# Patient Record
Sex: Male | Born: 1985 | Race: White | Marital: Married | Smoking: Former smoker
Health system: Southern US, Community
[De-identification: ages and names within clinical notes are randomized; demographics above are authoritative.]

## PROBLEM LIST (undated history)

## (undated) DIAGNOSIS — B019 Varicella without complication: Secondary | ICD-10-CM

## (undated) DIAGNOSIS — I10 Essential (primary) hypertension: Secondary | ICD-10-CM

## (undated) DIAGNOSIS — K219 Gastro-esophageal reflux disease without esophagitis: Secondary | ICD-10-CM

## (undated) HISTORY — DX: Varicella without complication: B01.9

## (undated) HISTORY — PX: VASECTOMY: SHX75

---

## 2010-08-14 HISTORY — PX: HERNIA REPAIR: SHX51

## 2010-11-29 ENCOUNTER — Other Ambulatory Visit: Payer: Self-pay | Admitting: Occupational Medicine

## 2010-11-29 ENCOUNTER — Ambulatory Visit
Admission: RE | Admit: 2010-11-29 | Discharge: 2010-11-29 | Disposition: A | Discharge status: Home / Self Care | Payer: No Typology Code available for payment source | Source: Ambulatory Visit | Attending: Occupational Medicine | Admitting: Occupational Medicine

## 2010-11-29 DIAGNOSIS — Z021 Encounter for pre-employment examination: Secondary | ICD-10-CM

## 2011-03-11 ENCOUNTER — Encounter: Payer: Self-pay | Admitting: *Deleted

## 2011-03-11 ENCOUNTER — Emergency Department (HOSPITAL_BASED_OUTPATIENT_CLINIC_OR_DEPARTMENT_OTHER)
Admission: EM | Admit: 2011-03-11 | Discharge: 2011-03-11 | Disposition: A | Discharge status: Home / Self Care | Payer: 59 | Attending: Emergency Medicine | Admitting: Emergency Medicine

## 2011-03-11 DIAGNOSIS — I10 Essential (primary) hypertension: Secondary | ICD-10-CM | POA: Insufficient documentation

## 2011-03-11 DIAGNOSIS — Y92009 Unspecified place in unspecified non-institutional (private) residence as the place of occurrence of the external cause: Secondary | ICD-10-CM | POA: Insufficient documentation

## 2011-03-11 DIAGNOSIS — W2209XA Striking against other stationary object, initial encounter: Secondary | ICD-10-CM | POA: Insufficient documentation

## 2011-03-11 DIAGNOSIS — S93336A Other dislocation of unspecified foot, initial encounter: Secondary | ICD-10-CM | POA: Insufficient documentation

## 2011-03-11 DIAGNOSIS — K219 Gastro-esophageal reflux disease without esophagitis: Secondary | ICD-10-CM | POA: Insufficient documentation

## 2011-03-11 HISTORY — DX: Gastro-esophageal reflux disease without esophagitis: K21.9

## 2011-03-11 HISTORY — DX: Essential (primary) hypertension: I10

## 2011-03-11 NOTE — ED Notes (Signed)
Pt states he caught his left little toe on a baby gate earlier this p.m. ? Dislocation.

## 2011-03-11 NOTE — ED Provider Notes (Signed)
History     Chief Complaint  Patient presents with  . Toe Injury   HPI Comments: Pt states that he hit his baby toe on gate and it is facing in the wrong dislocation  Patient is a 25 y.o. male presenting with toe pain. The history is provided by the patient. No language interpreter was used.  Toe Pain This is a new problem. The current episode started today. The problem has been unchanged. Pertinent negatives include no fever or numbness. The symptoms are aggravated by walking and twisting. He has tried nothing for the symptoms.    Past Medical History  Diagnosis Date  . Hypertension   . GERD (gastroesophageal reflux disease)     Past Surgical History  Procedure Date  . Hernia repair     History reviewed. No pertinent family history.  History  Substance Use Topics  . Smoking status: Never Smoker   . Smokeless tobacco: Not on file  . Alcohol Use: No      Review of Systems  Constitutional: Negative for fever.  Respiratory: Negative.   Cardiovascular: Negative.   Musculoskeletal:       Pt c/o pain and deformity to left small toe  Skin: Negative.   Neurological: Negative for numbness.  Psychiatric/Behavioral: Negative.     Physical Exam  BP 143/86  Pulse 56  Temp(Src) 98.7 F (37.1 C) (Oral)  Resp 20  Ht 6\' 5"  (1.956 m)  Wt 235 lb (106.595 kg)  BMI 27.87 kg/m2  SpO2 96%  Physical Exam  Nursing note and vitals reviewed. Constitutional: He appears well-developed and well-nourished.  Cardiovascular: Normal rate and regular rhythm.   Pulmonary/Chest: Effort normal.  Musculoskeletal:       Feet:  Skin: Skin is warm.    ED Course  ORTHOPEDIC INJURY TREATMENT Date/Time: 03/11/2011 8:54 PM Performed by: Teressa Lower Authorized by: Billee Cashing Consent: Verbal consent obtained. Risks and benefits: risks, benefits and alternatives were discussed Consent given by: patient Patient understanding: patient states understanding of the procedure  being performed Patient identity confirmed: verbally with patient Time out: Immediately prior to procedure a "time out" was called to verify the correct patient, procedure, equipment, support staff and site/side marked as required. Injury location: toe Location details: left fifth toe Injury type: dislocation Pre-procedure neurovascular assessment: neurovascularly intact Local anesthesia used: no Patient sedated: no Manipulation performed: yes Immobilization: tape Post-procedure neurovascular assessment: post-procedure neurovascularly intact Post-procedure distal perfusion: normal Post-procedure neurological function: normal Post-procedure range of motion: normal Patient tolerance: Patient tolerated the procedure well with no immediate complications.    MDM Pt declining x-ray;toe relocated based on physical exam appearance:toe neurologically intact      Teressa Lower, NP 03/11/11 2055

## 2011-03-12 NOTE — ED Provider Notes (Signed)
Medical screening examination/treatment/procedure(s) were performed by non-physician practitioner and as supervising physician I was immediately available for consultation/collaboration.  Juliet Rude. Rubin Payor, MD 03/12/11 2130

## 2011-12-14 DIAGNOSIS — K219 Gastro-esophageal reflux disease without esophagitis: Secondary | ICD-10-CM | POA: Insufficient documentation

## 2011-12-14 DIAGNOSIS — I10 Essential (primary) hypertension: Secondary | ICD-10-CM | POA: Insufficient documentation

## 2013-08-14 HISTORY — PX: WRIST SURGERY: SHX841

## 2013-09-15 ENCOUNTER — Emergency Department: Payer: Self-pay | Admitting: Emergency Medicine

## 2016-05-16 ENCOUNTER — Telehealth: Payer: Self-pay | Admitting: Family Medicine

## 2016-05-16 ENCOUNTER — Ambulatory Visit (INDEPENDENT_AMBULATORY_CARE_PROVIDER_SITE_OTHER): Payer: Managed Care, Other (non HMO) | Admitting: Family Medicine

## 2016-05-16 ENCOUNTER — Encounter: Payer: Self-pay | Admitting: Family Medicine

## 2016-05-16 VITALS — BP 142/102 | HR 71 | Temp 97.4°F | Ht 77.0 in | Wt 296.2 lb

## 2016-05-16 DIAGNOSIS — I10 Essential (primary) hypertension: Secondary | ICD-10-CM

## 2016-05-16 DIAGNOSIS — Z13220 Encounter for screening for lipoid disorders: Secondary | ICD-10-CM | POA: Diagnosis not present

## 2016-05-16 DIAGNOSIS — N529 Male erectile dysfunction, unspecified: Secondary | ICD-10-CM | POA: Diagnosis not present

## 2016-05-16 DIAGNOSIS — Z0000 Encounter for general adult medical examination without abnormal findings: Secondary | ICD-10-CM | POA: Diagnosis not present

## 2016-05-16 DIAGNOSIS — Z130 Encounter for screening for diseases of the blood and blood-forming organs and certain disorders involving the immune mechanism: Secondary | ICD-10-CM | POA: Diagnosis not present

## 2016-05-16 DIAGNOSIS — E669 Obesity, unspecified: Secondary | ICD-10-CM | POA: Diagnosis not present

## 2016-05-16 LAB — CBC
HEMATOCRIT: 44 % (ref 39.0–52.0)
HEMOGLOBIN: 14.8 g/dL (ref 13.0–17.0)
MCHC: 33.6 g/dL (ref 30.0–36.0)
MCV: 76 fl — AB (ref 78.0–100.0)
PLATELETS: 200 10*3/uL (ref 150.0–400.0)
RBC: 5.79 Mil/uL (ref 4.22–5.81)
RDW: 14.4 % (ref 11.5–15.5)
WBC: 6.3 10*3/uL (ref 4.0–10.5)

## 2016-05-16 LAB — COMPREHENSIVE METABOLIC PANEL
ALT: 18 U/L (ref 0–53)
AST: 17 U/L (ref 0–37)
Albumin: 4.5 g/dL (ref 3.5–5.2)
Alkaline Phosphatase: 43 U/L (ref 39–117)
BILIRUBIN TOTAL: 0.4 mg/dL (ref 0.2–1.2)
BUN: 14 mg/dL (ref 6–23)
CALCIUM: 9.4 mg/dL (ref 8.4–10.5)
CHLORIDE: 103 meq/L (ref 96–112)
CO2: 30 meq/L (ref 19–32)
Creatinine, Ser: 1.21 mg/dL (ref 0.40–1.50)
GFR: 74.22 mL/min (ref >60.00)
GLUCOSE: 88 mg/dL (ref 70–99)
POTASSIUM: 4.2 meq/L (ref 3.5–5.1)
Sodium: 139 mEq/L (ref 135–145)
Total Protein: 7.7 g/dL (ref 6.0–8.3)

## 2016-05-16 LAB — LIPID PANEL
CHOL/HDL RATIO: 4
Cholesterol: 194 mg/dL (ref 0–200)
HDL: 45.1 mg/dL (ref >39.00)
LDL Cholesterol: 129 mg/dL — ABNORMAL HIGH (ref 0–99)
NonHDL: 148.95
TRIGLYCERIDES: 101 mg/dL (ref 0.0–149.0)
VLDL: 20.2 mg/dL (ref 0.0–40.0)

## 2016-05-16 LAB — HEMOGLOBIN A1C: Hgb A1c MFr Bld: 5.6 % (ref 4.6–6.5)

## 2016-05-16 MED ORDER — SILDENAFIL CITRATE 20 MG PO TABS: ORAL_TABLET | ORAL | 0 refills | Status: DC

## 2016-05-16 MED ORDER — SILDENAFIL CITRATE 50 MG PO TABS: 50.0000 mg | ORAL_TABLET | Freq: Every day | ORAL | 0 refills | Status: DC | PRN

## 2016-05-16 NOTE — Assessment & Plan Note (Signed)
Uncontrolled. Will continue Losartan for now. Patient to log BP's and return them to the office over the next 2 weeks.

## 2016-05-16 NOTE — Telephone Encounter (Signed)
Pt called and asked if we could redo his prescription for sildenafil (VIAGRA) 50 MG tablet. Pharmacy told pt that if it is changed to 20 mg insurance will cover it. Thank you!  Call pt @ [phone]  Ph[facility]acy - [facility], [place]

## 2016-05-16 NOTE — Progress Notes (Signed)
Pre visit review using our clinic review tool, if applicable. No additional management support is needed unless otherwise documented below in the visit note. 

## 2016-05-16 NOTE — Addendum Note (Signed)
Addended by: Coral Spikes on: 05/16/2016 03:08 PM   Modules accepted: Orders

## 2016-05-16 NOTE — Assessment & Plan Note (Signed)
Declines flu. Tetanus up-to-date. Screening labs today. BP elevated slightly today and on repeat. Patient will continue losartan. Patient is to keep a log of his blood pressures and have them sent or faxed to the office over the next 2 weeks.

## 2016-05-16 NOTE — Progress Notes (Addendum)
Subjective:  Patient ID: Edgar Martinez, male    DOB: 03-16-1986  Age: 30 y.o. MRN: [id number]  CC: Establish care, HTN  HPI Edgar Martinez is a 30 y.o. male presents to the clinic today to establish care/physical exam.  Preventative Healthcare  Immunizations  Tetanus - 2014.    Flu - Declines.   Labs: Screening labs today. See orders.   Exercise: No regular exercise.   Alcohol use: Yes. Occasional.   Smoking/tobacco use: No.  STD/HIV testing: Declines.  Regular dental exams: Yes.  Wears seat belt: Yes.  HTN  Patient has long-standing history of hypertension.  He states he was diagnosis 16.  He has been on several medications and is currently on losartan.  He states that his blood pressure is often elevated.  We'll discuss today.  ED  Patient reported erectile dysfunction on review of systems.  He states that he has recently had difficulty maintaining an erection.  He states this is been going on for the past 2 months.  Patient states that he's been busy lately. He has a stressful job.  He states that his wife attributes this to stress and his high blood pressure.  We will discuss this and treatment options today.  PMH, Surgical Hx, Family Hx, Social History reviewed and updated as below.  Past Medical History:  Diagnosis Date  . Chicken pox   . GERD (gastroesophageal reflux disease)   . Hypertension    Past Surgical History:  Procedure Laterality Date  . HERNIA REPAIR  2012   Bilateral inguinal hernia with mesh.  Marland Kitchen VASECTOMY     2015  . WRIST SURGERY  2015   Family History  Problem Relation Age of Onset  . Hyperlipidemia Mother   . Hypertension Mother   . Hyperlipidemia Father   . Hypertension Father   . Heart disease Father   . Diabetes Maternal Grandfather   . Lung cancer Paternal Grandfather    Social History  Substance Use Topics  . Smoking status: Never Smoker  . Smokeless tobacco: Never Used  . Alcohol use Yes   Review of  Systems  Genitourinary:       ED.  Musculoskeletal:       Wrist pain.  Neurological: Positive for headaches.  Psychiatric/Behavioral:       Stress.  All other systems reviewed and are negative.  Objective:   Today's Vitals: BP (!) 142/102 (BP Location: Right Arm, Patient Position: Sitting, Cuff Size: Large)   Pulse 71   Temp 97.4 F (36.3 C) (Oral)   Ht _0  (1.956 m)   Wt 296 lb 4 oz (134.4 kg)   SpO2 98%   BMI 35.13 kg/m   Physical Exam  Constitutional: He is oriented to person, place, and time. He appears well-developed and well-nourished. No distress.  HENT:  Head: Normocephalic and atraumatic.  Nose: Nose normal.  Mouth/Throat: Oropharynx is clear and moist. No oropharyngeal exudate.  Normal TM's bilaterally.   Eyes: Conjunctivae are normal. No scleral icterus.  Neck: Neck supple. No thyromegaly present.  Cardiovascular: Normal rate and regular rhythm.   No murmur heard. Pulmonary/Chest: Effort normal and breath sounds normal. He has no wheezes. He has no rales.  Abdominal: Soft. He exhibits no distension. There is no tenderness. There is no rebound and no guarding.  Musculoskeletal: Normal range of motion. He exhibits no edema.  Lymphadenopathy:    He has no cervical adenopathy.  Neurological: He is alert and oriented to person, place, and  time.  Skin: Skin is warm and dry. No rash noted.  Psychiatric: He has a normal mood and affect.  Vitals reviewed.  Assessment & Plan:   Problem List Items Addressed This Visit    Annual physical exam - Primary    Declines flu. Tetanus up-to-date. Screening labs today. BP elevated slightly today and on repeat. Patient will continue losartan. Patient is to keep a log of his blood pressures and have them sent or faxed to the office over the next 2 weeks.      Essential hypertension    Uncontrolled. Will continue Losartan for now. Patient to log BP's and return them to the office over the next 2 weeks.      Relevant  Medications   losartan (COZAAR) 100 MG tablet   sildenafil (REVATIO) 20 MG tablet   Other Relevant Orders   Comp Met (CMET)   Erectile dysfunction    New problem. After discussion, patient elected for a trial of Viagra. Rx sent.       Other Visit Diagnoses    Screening for deficiency anemia       Relevant Orders   CBC   Screening, lipid       Relevant Orders   Lipid Profile   Obesity (BMI 30-39.9)       Relevant Orders   HgB A1c      Outpatient Encounter Prescriptions as of 05/16/2016  Medication Sig  . losartan (COZAAR) 100 MG tablet take 1 tablet by mouth once daily  . pantoprazole (PROTONIX) 40 MG tablet Take 80 mg by mouth daily.    . sildenafil (REVATIO) 20 MG tablet 1-5 tablets (20 - 100 mg) 1 hour prior to intercourse.  . [DISCONTINUED] amLODipine (NORVASC) 10 MG tablet Take 10 mg by mouth daily.    . [DISCONTINUED] sildenafil (VIAGRA) 50 MG tablet Take 1-2 tablets (50-100 mg total) by mouth daily as needed for erectile dysfunction. 1 hour prior to intercourse.  . [DISCONTINUED] TRIAMTERENE-HCTZ PO Take 0.5-1 tablets by mouth once as needed. For fluid    No facility-administered encounter medications on file as of 05/16/2016.     Follow-up: Pending blood pressure log/readings.  [place]

## 2016-05-16 NOTE — Assessment & Plan Note (Signed)
New problem. After discussion, patient elected for a trial of Viagra. Rx sent.

## 2016-05-16 NOTE — Patient Instructions (Signed)
Continue the losartan.  Keep a log of your pressures over the next two weeks and send them to me.  Take care  Dr. Lacinda Axon   Health Maintenance, Male A healthy lifestyle and preventative care can promote health and wellness.  Maintain regular health, dental, and eye exams.  Eat a healthy diet. Foods like vegetables, fruits, whole grains, low-fat dairy products, and lean protein foods contain the nutrients you need and are low in calories. Decrease your intake of foods high in solid fats, added sugars, and salt. Get information about a proper diet from your health care provider, if necessary.  Regular physical exercise is one of the most important things you can do for your health. Most adults should get at least 150 minutes of moderate-intensity exercise (any activity that increases your heart rate and causes you to sweat) each week. In addition, most adults need muscle-strengthening exercises on 2 or more days a week.   Maintain a healthy weight. The body mass index (BMI) is a screening tool to identify possible weight problems. It provides an estimate of body fat based on height and weight. Your health care provider can find your BMI and can help you achieve or maintain a healthy weight. For males 20 years and older:  A BMI below 18.5 is considered underweight.  A BMI of 18.5 to 24.9 is normal.  A BMI of 25 to 29.9 is considered overweight.  A BMI of 30 and above is considered obese.  Maintain normal blood lipids and cholesterol by exercising and minimizing your intake of saturated fat. Eat a balanced diet with plenty of fruits and vegetables. Blood tests for lipids and cholesterol should begin at age 56 and be repeated every 5 years. If your lipid or cholesterol levels are high, you are over age 70, or you are at high risk for heart disease, you may need your cholesterol levels checked more frequently.Ongoing high lipid and cholesterol levels should be treated with medicines if diet and  exercise are not working.  If you smoke, find out from your health care provider how to quit. If you do not use tobacco, do not start.  Lung cancer screening is recommended for adults aged 59-80 years who are at high risk for developing lung cancer because of a history of smoking. A yearly low-dose CT scan of the lungs is recommended for people who have at least a 30-pack-year history of smoking and are current smokers or have quit within the past 15 years. A pack year of smoking is smoking an average of 1 pack of cigarettes a day for 1 year (for example, a 30-pack-year history of smoking could mean smoking 1 pack a day for 30 years or 2 packs a day for 15 years). Yearly screening should continue until the smoker has stopped smoking for at least 15 years. Yearly screening should be stopped for people who develop a health problem that would prevent them from having lung cancer treatment.  If you choose to drink alcohol, do not have more than 2 drinks per day. One drink is considered to be 12 oz (360 mL) of beer, 5 oz (150 mL) of wine, or 1.5 oz (45 mL) of liquor.  Avoid the use of street drugs. Do not share needles with anyone. Ask for help if you need support or instructions about stopping the use of drugs.  High blood pressure causes heart disease and increases the risk of stroke. High blood pressure is more likely to develop in:  People  who have blood pressure in the end of the normal range (100-139/85-89 mm Hg).  People who are overweight or obese.  People who are African American.  If you are 23-27 years of age, have your blood pressure checked every 3-5 years. If you are 82 years of age or older, have your blood pressure checked every year. You should have your blood pressure measured twice--once when you are at a hospital or clinic, and once when you are not at a hospital or clinic. Record the average of the two measurements. To check your blood pressure when you are not at a hospital or  clinic, you can use:  An automated blood pressure machine at a pharmacy.  A home blood pressure monitor.  If you are 70-45 years old, ask your health care provider if you should take aspirin to prevent heart disease.  Diabetes screening involves taking a blood sample to check your fasting blood sugar level. This should be done once every 3 years after age 84 if you are at a normal weight and without risk factors for diabetes. Testing should be considered at a younger age or be carried out more frequently if you are overweight and have at least 1 risk factor for diabetes.  Colorectal cancer can be detected and often prevented. Most routine colorectal cancer screening begins at the age of 71 and continues through age 11. However, your health care provider may recommend screening at an earlier age if you have risk factors for colon cancer. On a yearly basis, your health care provider may provide home test kits to check for hidden blood in the stool. A small camera at the end of a tube may be used to directly examine the colon (sigmoidoscopy or colonoscopy) to detect the earliest forms of colorectal cancer. Talk to your health care provider about this at age 24 when routine screening begins. A direct exam of the colon should be repeated every 5-10 years through age 56, unless early forms of precancerous polyps or small growths are found.  People who are at an increased risk for hepatitis B should be screened for this virus. You are considered at high risk for hepatitis B if:  You were born in a country where hepatitis B occurs often. Talk with your health care provider about which countries are considered high risk.  Your parents were born in a high-risk country and you have not received a shot to protect against hepatitis B (hepatitis B vaccine).  You have HIV or AIDS.  You use needles to inject street drugs.  You live with, or have sex with, someone who has hepatitis B.  You are a man who has  sex with other men (MSM).  You get hemodialysis treatment.  You take certain medicines for conditions like cancer, organ transplantation, and autoimmune conditions.  Hepatitis C blood testing is recommended for all people born from [birth year] through 1966 and any individual with known risk factors for hepatitis C.  Healthy men should no longer receive prostate-specific antigen (PSA) blood tests as part of routine cancer screening. Talk to your health care provider about prostate cancer screening.  Testicular cancer screening is not recommended for adolescents or adult males who have no symptoms. Screening includes self-exam, a health care provider exam, and other screening tests. Consult with your health care provider about any symptoms you have or any concerns you have about testicular cancer.  Practice safe sex. Use condoms and avoid high-risk sexual practices to reduce the spread of sexually  transmitted infections (STIs).  You should be screened for STIs, including gonorrhea and chlamydia if:  You are sexually active and are younger than 24 years.  You are older than 24 years, and your health care provider tells you that you are at risk for this type of infection.  Your sexual activity has changed since you were last screened, and you are at an increased risk for chlamydia or gonorrhea. Ask your health care provider if you are at risk.  If you are at risk of being infected with HIV, it is recommended that you take a prescription medicine daily to prevent HIV infection. This is called pre-exposure prophylaxis (PrEP). You are considered at risk if:  You are a man who has sex with other men (MSM).  You are a heterosexual man who is sexually active with multiple partners.  You take drugs by injection.  You are sexually active with a partner who has HIV.  Talk with your health care provider about whether you are at high risk of being infected with HIV. If you choose to begin PrEP, you should  first be tested for HIV. You should then be tested every 3 months for as long as you are taking PrEP.  Use sunscreen. Apply sunscreen liberally and repeatedly throughout the day. You should seek shade when your shadow is shorter than you. Protect yourself by wearing long sleeves, pants, a wide-brimmed hat, and sunglasses year round whenever you are outdoors.  Tell your health care provider of new moles or changes in moles, especially if there is a change in shape or color. Also, tell your health care provider if a mole is larger than the size of a pencil eraser.  A one-time screening for abdominal aortic aneurysm (AAA) and surgical repair of large AAAs by ultrasound is recommended for men aged 92-75 years who are current or former smokers.  Stay current with your vaccines (immunizations).   This information is not intended to replace advice given to you by your health care provider. Make sure you discuss any questions you have with your health care provider.   Document Released: 01/27/2008 Document Revised: 08/21/2014 Document Reviewed: 12/26/2010 Elsevier Interactive Patient Education [employer].

## 2016-05-16 NOTE — Telephone Encounter (Signed)
PCP changed RX and pt made aware of new rx at pharmacy.

## 2016-07-05 ENCOUNTER — Other Ambulatory Visit: Payer: Self-pay | Admitting: Family Medicine

## 2016-07-05 ENCOUNTER — Encounter: Payer: Self-pay | Admitting: Family Medicine

## 2016-07-05 MED ORDER — PANTOPRAZOLE SODIUM 40 MG PO TBEC: 80.0000 mg | DELAYED_RELEASE_TABLET | Freq: Every day | ORAL | 3 refills | Status: DC

## 2016-07-05 NOTE — Telephone Encounter (Signed)
Verbal ok by PCP.

## 2016-11-17 ENCOUNTER — Encounter: Payer: Self-pay | Admitting: Family Medicine

## 2016-11-17 ENCOUNTER — Ambulatory Visit (INDEPENDENT_AMBULATORY_CARE_PROVIDER_SITE_OTHER): Payer: Managed Care, Other (non HMO) | Admitting: Family Medicine

## 2016-11-17 DIAGNOSIS — D229 Melanocytic nevi, unspecified: Secondary | ICD-10-CM | POA: Diagnosis not present

## 2016-11-17 DIAGNOSIS — K219 Gastro-esophageal reflux disease without esophagitis: Secondary | ICD-10-CM | POA: Diagnosis not present

## 2016-11-17 DIAGNOSIS — I10 Essential (primary) hypertension: Secondary | ICD-10-CM | POA: Diagnosis not present

## 2016-11-17 MED ORDER — PANTOPRAZOLE SODIUM 40 MG PO TBEC: 80.0000 mg | DELAYED_RELEASE_TABLET | Freq: Every day | ORAL | 3 refills | Status: DC

## 2016-11-17 MED ORDER — LOSARTAN POTASSIUM 100 MG PO TABS: 100.0000 mg | ORAL_TABLET | Freq: Every day | ORAL | 3 refills | Status: DC

## 2016-11-17 NOTE — Patient Instructions (Signed)
Labs in 7-10 days.  We will call with derm referral.  Follow up in 6 months.  Take care  Dr. Lacinda Axon

## 2016-11-17 NOTE — Progress Notes (Signed)
Pre visit review using our clinic review tool, if applicable. No additional management support is needed unless otherwise documented below in the visit note. 

## 2016-11-19 DIAGNOSIS — D229 Melanocytic nevi, unspecified: Secondary | ICD-10-CM | POA: Insufficient documentation

## 2016-11-19 DIAGNOSIS — K219 Gastro-esophageal reflux disease without esophagitis: Secondary | ICD-10-CM | POA: Insufficient documentation

## 2016-11-19 NOTE — Progress Notes (Signed)
Subjective:  Patient ID: Edgar Martinez, male    DOB: 05-01-1986  Age: 31 y.o. MRN: [id number]  CC: Follow up; Skin lesion  HPI:  31 year old male with HTN, GERD presents for follow up. Concerns are below.  HTN  Uncontrolled.   Patient has been out of his medication for approximately 2 months.  He has not called for a refill.  He states that he has been very busy.  GERD  Has been stable on Protonix.  Needs refill today.  Skin lesion  Patient has a skin lesion on his right shoulder that has been changing.  Patient reports that it has been slowly getting darker.  His wife is concerned about the possibility of melanoma.  He would like this examined today.  Social Hx   Social History   Social History  . Marital status: Married    Spouse name: N/A  . Number of children: N/A  . Years of education: N/A   Social History Main Topics  . Smoking status: Never Smoker  . Smokeless tobacco: Never Used  . Alcohol use Yes  . Drug use: No  . Sexual activity: Yes    Partners: Female   Other Topics Concern  . None   Social History Narrative  . None    Review of Systems  Constitutional: Negative.   Skin: Positive for color change.    Objective:  BP (!) 159/102   Pulse 71   Temp 98 F (36.7 C) (Oral)   Wt (!) 310 lb 2 oz (140.7 kg)   SpO2 97%   BMI 36.78 kg/m   BP/Weight 11/17/2016 05/16/2016 7/67/3419  Systolic BP 379 024 097  Diastolic BP 353 299 86  Wt. (Lbs) 310.13 296.25 235  BMI 36.78 35.13 27.86    Physical Exam  Constitutional: He is oriented to person, place, and time. He appears well-developed. No distress.  Cardiovascular: Normal rate and regular rhythm.   Pulmonary/Chest: Effort normal and breath sounds normal.  Neurological: He is alert and oriented to person, place, and time.  Skin:  Right shoulder - small dark nevus noted. Border is slightly irregular. Color changed noted.   Psychiatric: He has a normal mood and affect.  Vitals  reviewed.   Lab Results  Component Value Date   WBC 6.3 05/16/2016   HGB 14.8 05/16/2016   HCT 44.0 05/16/2016   PLT 200.0 05/16/2016   GLUCOSE 88 05/16/2016   CHOL 194 05/16/2016   TRIG 101.0 05/16/2016   HDL 45.10 05/16/2016   LDLCALC 129 (H) 05/16/2016   ALT 18 05/16/2016   AST 17 05/16/2016   NA 139 05/16/2016   K 4.2 05/16/2016   CL 103 05/16/2016   CREATININE 1.21 05/16/2016   BUN 14 05/16/2016   CO2 30 05/16/2016   HGBA1C 5.6 05/16/2016    Assessment & Plan:   Problem List Items Addressed This Visit    GERD (gastroesophageal reflux disease)    Has been stable. Refilled protonix today.      Relevant Medications   pantoprazole (PROTONIX) 40 MG tablet   Essential hypertension    Uncontrolled. Labs in 7-10 days. Restarting Losartan. Refilled today.      Relevant Medications   losartan (COZAAR) 100 MG tablet   Changing nevus    New problem. Referring to derm for removal.      Relevant Orders   Ambulatory referral to Dermatology      Meds ordered this encounter  Medications  . losartan (COZAAR) 100  MG tablet    Sig: Take 1 tablet (100 mg total) by mouth daily.    Dispense:  90 tablet    Refill:  3  . pantoprazole (PROTONIX) 40 MG tablet    Sig: Take 2 tablets (80 mg total) by mouth daily.    Dispense:  90 tablet    Refill:  3    Follow-up: 6 months  [place] DO [facility]

## 2016-11-19 NOTE — Assessment & Plan Note (Signed)
Uncontrolled. Labs in 7-10 days. Restarting Losartan. Refilled today.

## 2016-11-19 NOTE — Assessment & Plan Note (Signed)
Has been stable. Refilled protonix today.

## 2016-11-19 NOTE — Assessment & Plan Note (Signed)
New problem. Referring to derm for removal.

## 2016-11-23 ENCOUNTER — Encounter: Payer: Self-pay | Admitting: Family Medicine

## 2016-11-29 ENCOUNTER — Telehealth: Payer: Self-pay | Admitting: Radiology

## 2016-11-29 NOTE — Telephone Encounter (Signed)
Pt coming in for labs tomorrow, please place future orders. Thank you.  

## 2016-11-30 ENCOUNTER — Other Ambulatory Visit (INDEPENDENT_AMBULATORY_CARE_PROVIDER_SITE_OTHER): Payer: Managed Care, Other (non HMO)

## 2016-11-30 ENCOUNTER — Other Ambulatory Visit: Payer: Self-pay | Admitting: Family Medicine

## 2016-11-30 DIAGNOSIS — I10 Essential (primary) hypertension: Secondary | ICD-10-CM | POA: Diagnosis not present

## 2016-11-30 LAB — BASIC METABOLIC PANEL
BUN: 11 mg/dL (ref 6–23)
CALCIUM: 9.3 mg/dL (ref 8.4–10.5)
CO2: 29 meq/L (ref 19–32)
CREATININE: 1.38 mg/dL (ref 0.40–1.50)
Chloride: 105 mEq/L (ref 96–112)
GFR: 63.55 mL/min (ref >60.00)
GLUCOSE: 97 mg/dL (ref 70–99)
Potassium: 4.2 mEq/L (ref 3.5–5.1)
Sodium: 138 mEq/L (ref 135–145)

## 2016-12-15 ENCOUNTER — Encounter: Payer: Self-pay | Admitting: Family Medicine

## 2017-06-05 ENCOUNTER — Other Ambulatory Visit: Payer: Self-pay | Admitting: Family Medicine

## 2017-06-05 NOTE — Telephone Encounter (Signed)
Pt called requesting a refill for pantoprazole (PROTONIX) 40 MG tablet. Pt is scheduled with Dr. Caryl Bis 09/12/17. Please advise, thank you!  Ph[facility] - [facility] [place], [place]  Call pt @ [phone]

## 2017-06-05 NOTE — Telephone Encounter (Signed)
Please contact the patient. The Protonix dose is higher than the typical dose. Please confirm that he's been taking this. He also needs to set up follow-up for his blood pressure and his reflux. Thanks.

## 2017-06-05 NOTE — Telephone Encounter (Signed)
Last OV 11/17/16 with DR.Cook last filled 11/17/16 90 [id number]

## 2017-06-08 NOTE — Telephone Encounter (Signed)
Patient is set up for 09/12/16 does he need to be seen sooner?

## 2017-06-08 NOTE — Telephone Encounter (Signed)
He does not need to be seen sooner necessarily though I do want to know what the dose is that he is taking. Thanks.

## 2017-06-12 MED ORDER — PANTOPRAZOLE SODIUM 40 MG PO TBEC: 80.0000 mg | DELAYED_RELEASE_TABLET | Freq: Every day | ORAL | 3 refills | Status: DC

## 2017-06-12 NOTE — Telephone Encounter (Signed)
Patient states he takes 80mg  daily

## 2017-07-31 ENCOUNTER — Other Ambulatory Visit: Payer: Self-pay | Admitting: Family Medicine

## 2017-08-22 ENCOUNTER — Ambulatory Visit: Payer: PRIVATE HEALTH INSURANCE | Admitting: Physician Assistant

## 2017-08-22 ENCOUNTER — Encounter: Payer: Self-pay | Admitting: Physician Assistant

## 2017-08-22 VITALS — BP 148/102 | HR 80 | Temp 98.6°F | Resp 16 | Ht 77.0 in | Wt 305.0 lb

## 2017-08-22 DIAGNOSIS — I10 Essential (primary) hypertension: Secondary | ICD-10-CM

## 2017-08-22 DIAGNOSIS — Z6836 Body mass index (BMI) 36.0-36.9, adult: Secondary | ICD-10-CM | POA: Diagnosis not present

## 2017-08-22 DIAGNOSIS — K219 Gastro-esophageal reflux disease without esophagitis: Secondary | ICD-10-CM | POA: Diagnosis not present

## 2017-08-22 MED ORDER — LOSARTAN POTASSIUM-HCTZ 100-12.5 MG PO TABS: 1.0000 | ORAL_TABLET | Freq: Every day | ORAL | 3 refills | Status: DC

## 2017-08-22 MED ORDER — PANTOPRAZOLE SODIUM 40 MG PO TBEC
40.0000 mg | DELAYED_RELEASE_TABLET | Freq: Every day | ORAL | 3 refills | Status: DC
Start: 2017-08-22 — End: 2017-10-29

## 2017-08-22 NOTE — Progress Notes (Signed)
Patient: Edgar Martinez Male    DOB: 07/05/1986   32 y.o.   MRN: [id number] Visit Date: 08/22/2017  Today's Provider: Trinna Post, PA-C   Chief Complaint  Patient presents with  . Hypertension  . Gastroesophageal Reflux   Subjective:    Edgar Martinez is a 32 y/o man presenting today to establish care. He was previously seen by Dr. Lacinda Axon at [facility] but he has since left the practice.   He is living with his wife of ten years in [place] and his two children, a daughter age 71 and a 36 age 35.  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   He has a longstanding history of hypertension, ever since he was 83. He reports that there has been a workup for secondary hypertension including renal ultrasound, stress testing, and urine metanephrines. He reports this workup was negative and it was concluded that this was primary hypertension, especially considering his father had HTN at a very young age. He is currently on Losartan 100 mg QD. He reports in the past he has been on amlodipine, which was d/c due to pedal edema. He was on tenormin and benicar. He was also on a beta blocker but stopped due to low heart rate. At one point he was on HCTZ but this was discontinued for unknown reason.   He also reports history of GERD x 5 years. 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 Since then he has steadily been gaining weight. He says remotely he was put on Protonix 40 mg BID by GI. He reports insurance is now refusing to fill this. He says his diet is a mixture of carbs and proteins. He reports some fast food, some fried foods, he has discontinued mountain dew but continues to drink coke zero. He reports he drinks no coffee. He reports he dislikes vegetables. Does sometimes eat right before bed.   He has an A1C from 05/2017 which was 5.6%.  Had a history of erectile dysfunction which was mainly due to  stress. No longer uses Rx for Sildenafil.    Hypertension  This is a chronic problem. The problem is unchanged. The problem is uncontrolled. Pertinent negatives include no anxiety, blurred vision, chest pain, headaches, malaise/fatigue, neck pain, orthopnea, palpitations, peripheral edema, PND, shortness of breath or sweats. There are no associated agents to hypertension. Risk factors for coronary artery disease include male gender, obesity and family history. There are no compliance problems.   Gastroesophageal Reflux  He complains of coughing. He reports no chest pain, no choking or no wheezing. This is a chronic problem. The problem has been gradually worsening. He has tried a PPI for the symptoms.       No Known Allergies   Current Outpatient Medications:  .  losartan (COZAAR) 100 MG tablet, Take 1 tablet (100 mg total) by mouth daily., Disp: 90 tablet, Rfl: 3 .  pantoprazole (PROTONIX) 40 MG tablet, take 2 tablets by mouth daily, Disp: 90 tablet, Rfl: 0 .  sildenafil (REVATIO) 20 MG tablet, 1-5 tablets (20 - 100 mg) 1 hour prior to intercourse., Disp: 30 tablet, Rfl: 0  Review of Systems  Constitutional: Negative.  Negative for malaise/fatigue.  HENT: Negative.   Eyes: Negative.  Negative for blurred vision.  Respiratory: Positive for cough. Negative for apnea, choking, chest tightness, shortness of breath,  wheezing and stridor.   Cardiovascular: Negative for chest pain, palpitations, orthopnea and PND.  Gastrointestinal: Negative.   Endocrine: Negative.   Genitourinary: Negative.   Musculoskeletal: Negative.  Negative for neck pain.  Skin: Negative.   Allergic/Immunologic: Negative.   Neurological: Negative.  Negative for headaches.  Hematological: Negative.   Psychiatric/Behavioral: Negative.     Social History   Tobacco Use  . Smoking status: Never Smoker  . Smokeless tobacco: Never Used  Substance Use Topics  . Alcohol use: Yes   Objective:   BP (!) 148/102 (BP  Location: Right Arm, Patient Position: Sitting, Cuff Size: Large)   Pulse 80   Temp 98.6 F (37 C) (Oral)   Resp 16   Ht 6\' 5"  (1.956 m)   Wt (!) 305 lb (138.3 kg)   BMI 36.17 kg/m  Vitals:   08/22/17 1410  BP: (!) 148/102  Pulse: 80  Resp: 16  Temp: 98.6 F (37 C)  TempSrc: Oral  Weight: (!) 305 lb (138.3 kg)  Height: 6\' 5"  (1.956 m)     Physical Exam  Constitutional: He is oriented to person, place, and time. He appears well-developed and well-nourished.  HENT:  Right Ear: External ear normal.  Left Ear: External ear normal.  Mouth/Throat: Oropharynx is clear and moist. No oropharyngeal exudate.  Eyes: Conjunctivae are normal.  Neck: Neck supple.  Cardiovascular: Normal rate and regular rhythm.  Pulmonary/Chest: Effort normal and breath sounds normal.  Abdominal: Soft. Bowel sounds are normal.  Lymphadenopathy:    He has no cervical adenopathy.  Neurological: He is alert and oriented to person, place, and time.  Skin: Skin is warm and dry.  Psychiatric: He has a normal mood and affect. His behavior is normal.        Assessment & Plan:     1. Essential hypertension  Discussed that HTN is not controlled today. Discussed adding medication. Agreed upon HCTZ, discussed this does need follow up labs.  - losartan-hydrochlorothiazide (HYZAAR) 100-12.5 MG tablet; Take 1 tablet by mouth daily.  Dispense: 90 tablet; Refill: 3  2. Gastroesophageal reflux disease without esophagitis  Patient has been using 40 mg BID. He has no history of peptic ulcer or esophagitis. Discussed that outside of 8 week period treatment for PUD, this is over the limit of antacids and can contribute to kidney dysfunction and bone loss. Will refill at protonix 40 mg QD. Can supplement with Zantac for break through. But we do need to address root causes such as dietary triggers and obesity, which is increasing intraabdominal pressure. Referral to weight mgmt below.   3. Class 2 severe obesity with  serious comorbidity and body mass index (BMI) of 36.0 to 36.9 in adult, unspecified obesity type ([place])  - Amb Ref to Medical Weight Management  Return in about 6 months (around 02/19/2018) for HTN.  The entirety of the information documented in the History of Present Illness, Review of Systems and Physical Exam were personally obtained by me. Portions of this information were initially documented by Ashley Royalty, CMA and reviewed by me for thoroughness and accuracy.        Trinna Post, PA-C  [facility]

## 2017-08-22 NOTE — Patient Instructions (Signed)

## 2017-09-12 ENCOUNTER — Ambulatory Visit: Payer: Managed Care, Other (non HMO) | Admitting: Family Medicine

## 2017-10-29 ENCOUNTER — Other Ambulatory Visit: Payer: Self-pay | Admitting: Physician Assistant

## 2017-10-29 DIAGNOSIS — K219 Gastro-esophageal reflux disease without esophagitis: Secondary | ICD-10-CM

## 2017-12-13 ENCOUNTER — Encounter (INDEPENDENT_AMBULATORY_CARE_PROVIDER_SITE_OTHER): Payer: Self-pay

## 2017-12-19 ENCOUNTER — Ambulatory Visit (INDEPENDENT_AMBULATORY_CARE_PROVIDER_SITE_OTHER): Payer: Self-pay | Admitting: Family Medicine

## 2018-01-03 ENCOUNTER — Other Ambulatory Visit: Payer: Self-pay | Admitting: Physician Assistant

## 2018-01-03 DIAGNOSIS — K219 Gastro-esophageal reflux disease without esophagitis: Secondary | ICD-10-CM

## 2018-02-20 ENCOUNTER — Encounter: Payer: Self-pay | Admitting: Physician Assistant

## 2018-02-20 ENCOUNTER — Ambulatory Visit: Payer: PRIVATE HEALTH INSURANCE | Admitting: Physician Assistant

## 2018-02-20 VITALS — BP 138/86 | HR 80 | Temp 98.2°F | Resp 16 | Wt 304.0 lb

## 2018-02-20 DIAGNOSIS — Z1329 Encounter for screening for other suspected endocrine disorder: Secondary | ICD-10-CM

## 2018-02-20 DIAGNOSIS — Z13220 Encounter for screening for lipoid disorders: Secondary | ICD-10-CM

## 2018-02-20 DIAGNOSIS — I10 Essential (primary) hypertension: Secondary | ICD-10-CM | POA: Diagnosis not present

## 2018-02-20 DIAGNOSIS — Z114 Encounter for screening for human immunodeficiency virus [HIV]: Secondary | ICD-10-CM

## 2018-02-20 DIAGNOSIS — Z130 Encounter for screening for diseases of the blood and blood-forming organs and certain disorders involving the immune mechanism: Secondary | ICD-10-CM

## 2018-02-20 NOTE — Progress Notes (Signed)
Patient: Edgar Martinez Male    DOB: [birth year]   32 y.o.   MRN: [id number] Visit Date: 02/20/2018  Today's Provider: Trinna Post, PA-C   Chief Complaint  Patient presents with  . Hypertension   Subjective:    Edgar Martinez was seen 6 mo ago in clinic for HTN and was started on Hyzaar 100-12.5 mg. His blood pressure has improved. Denies symptoms as below.  BP Readings from Last 3 Encounters:  02/20/18 138/86  08/22/17 (!) 148/102  11/17/16 (!) 159/102    Hypertension  This is a chronic problem. The problem is unchanged. Pertinent negatives include no anxiety, blurred vision, chest pain, headaches, malaise/fatigue, neck pain, palpitations, peripheral edema, shortness of breath or sweats. There are no associated agents to hypertension. Risk factors for coronary artery disease include obesity and male gender. There are no compliance problems.        No Known Allergies   Current Outpatient Medications:  .  losartan-hydrochlorothiazide (HYZAAR) 100-12.5 MG tablet, Take 1 tablet by mouth daily., Disp: 90 tablet, Rfl: 3 .  pantoprazole (PROTONIX) 40 MG tablet, TAKE 1 TABLET BY MOUTH ONCE DAILY, Disp: 30 tablet, Rfl: 0 .  sildenafil (REVATIO) 20 MG tablet, 1-5 tablets (20 - 100 mg) 1 hour prior to intercourse. (Patient not taking: Reported on 02/20/2018), Disp: 30 tablet, Rfl: 0  Review of Systems  Constitutional: Negative.  Negative for malaise/fatigue.  Eyes: Negative for blurred vision.  Respiratory: Negative.  Negative for shortness of breath.   Cardiovascular: Negative.  Negative for chest pain and palpitations.  Gastrointestinal: Negative.   Musculoskeletal: Negative for neck pain.  Neurological: Negative for dizziness, light-headedness and headaches.    Social History   Tobacco Use  . Smoking status: Never Smoker  . Smokeless tobacco: Never Used  Substance Use Topics  . Alcohol use: Yes   Objective:   BP 138/86 (BP Location: Right Arm, Patient Position:  Sitting, Cuff Size: Large)   Pulse 80   Temp 98.2 F (36.8 C) (Oral)   Resp 16   Wt (!) 304 lb (137.9 kg)   BMI 36.05 kg/m  Vitals:   02/20/18 1632  BP: 138/86  Pulse: 80  Resp: 16  Temp: 98.2 F (36.8 C)  TempSrc: Oral  Weight: (!) 304 lb (137.9 kg)     Physical Exam  Constitutional: He is oriented to person, place, and time. He appears well-developed and well-nourished.  Cardiovascular: Normal rate and regular rhythm.  Pulmonary/Chest: Effort normal and breath sounds normal.  Musculoskeletal: He exhibits no edema.  Neurological: He is alert and oriented to person, place, and time.  Skin: Skin is warm and dry.  Psychiatric: He has a normal mood and affect. His behavior is normal.        Assessment & Plan:     1. Essential hypertension  Will get labwork tomorrow. If normal, will increase Hyzaar to 100-25 mg.   - Comprehensive Metabolic Panel (CMET)  2. Encounter for screening for HIV  - HIV antibody (with reflex)  3. Screening for deficiency anemia  - CBC with Differential  4. Thyroid disorder screening  - TSH  5. Lipid screening  - Lipid Profile  Return in about 6 months (around 08/23/2018) for HTN.  The entirety of the information documented in the History of Present Illness, Review of Systems and Physical Exam were personally obtained by me. Portions of this information were initially documented by Ashley Royalty, CMA and reviewed by me for  thoroughness and accuracy.        Trinna Post, PA-C  [facility]

## 2018-02-20 NOTE — Patient Instructions (Signed)

## 2018-02-23 LAB — CBC WITH DIFFERENTIAL/PLATELET
Basophils Absolute: 0 10*3/uL (ref 0.0–0.2)
Basos: 0 %
EOS (ABSOLUTE): 0.2 10*3/uL (ref 0.0–0.4)
Eos: 3 %
Hematocrit: 43.7 % (ref 37.5–51.0)
Hemoglobin: 14.7 g/dL (ref 13.0–17.7)
Immature Grans (Abs): 0 10*3/uL (ref 0.0–0.1)
Immature Granulocytes: 0 %
Lymphocytes Absolute: 2.6 10*3/uL (ref 0.7–3.1)
Lymphs: 39 %
MCH: 26 pg — ABNORMAL LOW (ref 26.6–33.0)
MCHC: 33.6 g/dL (ref 31.5–35.7)
MCV: 77 fL — ABNORMAL LOW (ref 79–97)
Monocytes Absolute: 0.6 10*3/uL (ref 0.1–0.9)
Monocytes: 9 %
Neutrophils Absolute: 3.3 10*3/uL (ref 1.4–7.0)
Neutrophils: 49 %
Platelets: 218 10*3/uL (ref 150–450)
RBC: 5.66 x10E6/uL (ref 4.14–5.80)
RDW: 14.6 % (ref 12.3–15.4)
WBC: 6.6 10*3/uL (ref 3.4–10.8)

## 2018-02-23 LAB — COMPREHENSIVE METABOLIC PANEL
ALT: 17 IU/L (ref 0–44)
AST: 17 IU/L (ref 0–40)
Albumin/Globulin Ratio: 1.6 (ref 1.2–2.2)
Albumin: 4.4 g/dL (ref 3.5–5.5)
Alkaline Phosphatase: 50 IU/L (ref 39–117)
BUN/Creatinine Ratio: 10 (ref 9–20)
BUN: 13 mg/dL (ref 6–20)
Bilirubin Total: 0.4 mg/dL (ref 0.0–1.2)
CO2: 24 mmol/L (ref 20–29)
Calcium: 9.9 mg/dL (ref 8.7–10.2)
Chloride: 101 mmol/L (ref 96–106)
Creatinine, Ser: 1.32 mg/dL — ABNORMAL HIGH (ref 0.76–1.27)
GFR calc Af Amer: 81 mL/min/{1.73_m2} (ref >59)
GFR calc non Af Amer: 70 mL/min/{1.73_m2} (ref >59)
Globulin, Total: 2.7 g/dL (ref 1.5–4.5)
Glucose: 90 mg/dL (ref 65–99)
Potassium: 4.1 mmol/L (ref 3.5–5.2)
Sodium: 141 mmol/L (ref 134–144)
Total Protein: 7.1 g/dL (ref 6.0–8.5)

## 2018-02-23 LAB — LIPID PANEL
Chol/HDL Ratio: 5.3 ratio — ABNORMAL HIGH (ref 0.0–5.0)
Cholesterol, Total: 186 mg/dL (ref 100–199)
HDL: 35 mg/dL — ABNORMAL LOW (ref >39)
LDL Calculated: 122 mg/dL — ABNORMAL HIGH (ref 0–99)
Triglycerides: 147 mg/dL (ref 0–149)
VLDL Cholesterol Cal: 29 mg/dL (ref 5–40)

## 2018-02-23 LAB — TSH: TSH: 2.03 u[IU]/mL (ref 0.450–4.500)

## 2018-02-23 LAB — HIV ANTIBODY (ROUTINE TESTING W REFLEX): HIV Screen 4th Generation wRfx: NONREACTIVE

## 2018-02-28 ENCOUNTER — Encounter: Payer: Self-pay | Admitting: Physician Assistant

## 2018-02-28 DIAGNOSIS — I10 Essential (primary) hypertension: Secondary | ICD-10-CM

## 2018-02-28 MED ORDER — LOSARTAN POTASSIUM-HCTZ 100-25 MG PO TABS: 1.0000 | ORAL_TABLET | Freq: Every day | ORAL | 1 refills | Status: DC

## 2018-04-05 ENCOUNTER — Encounter: Payer: Self-pay | Admitting: Physician Assistant

## 2018-04-05 ENCOUNTER — Ambulatory Visit: Payer: PRIVATE HEALTH INSURANCE | Admitting: Physician Assistant

## 2018-04-05 VITALS — BP 138/82 | HR 70 | Temp 98.7°F | Resp 16 | Ht 77.0 in | Wt 299.0 lb

## 2018-04-05 DIAGNOSIS — H60331 Swimmer's ear, right ear: Secondary | ICD-10-CM

## 2018-04-05 MED ORDER — NEOMYCIN-POLYMYXIN-HC 3.5-10000-1 OT SOLN: 4.0000 [drp] | Freq: Four times a day (QID) | OTIC | 0 refills | Status: DC

## 2018-04-05 NOTE — Progress Notes (Signed)
       Patient: Edgar Martinez Male    DOB: 13-Aug-1986   32 y.o.   MRN: [id number] Visit Date: 04/05/2018  Today's Provider: Trinna Post, PA-C   Chief Complaint  Patient presents with  . Ear Pain   Subjective:    Otalgia   There is pain in the right ear. The current episode started more than 1 month ago. The problem has been unchanged. There has been no fever. Associated symptoms include neck pain. Pertinent negatives include no coughing, ear discharge, headaches, rhinorrhea, sore throat or vomiting. He has tried acetaminophen for the symptoms.       No Known Allergies   Current Outpatient Medications:  .  losartan-hydrochlorothiazide (HYZAAR) 100-25 MG tablet, Take 1 tablet by mouth daily., Disp: 90 tablet, Rfl: 1 .  pantoprazole (PROTONIX) 40 MG tablet, TAKE 1 TABLET BY MOUTH ONCE DAILY, Disp: 30 tablet, Rfl: 0 .  neomycin-polymyxin-hydrocortisone (CORTISPORIN) OTIC solution, Place 4 drops into the right ear 4 (four) times daily., Disp: 10 mL, Rfl: 0 .  sildenafil (REVATIO) 20 MG tablet, 1-5 tablets (20 - 100 mg) 1 hour prior to intercourse. (Patient not taking: Reported on 02/20/2018), Disp: 30 tablet, Rfl: 0  Review of Systems  HENT: Positive for ear pain. Negative for ear discharge, rhinorrhea and sore throat.   Respiratory: Negative for cough.   Gastrointestinal: Negative for vomiting.  Musculoskeletal: Positive for neck pain.  Neurological: Negative for headaches.    Social History   Tobacco Use  . Smoking status: Never Smoker  . Smokeless tobacco: Never Used  Substance Use Topics  . Alcohol use: Yes   Objective:   BP 138/82 (BP Location: Right Arm, Patient Position: Sitting, Cuff Size: Normal)   Pulse 70   Temp 98.7 F (37.1 C)   Resp 16   Ht 6\' 5"  (1.956 m)   Wt 299 lb (135.6 kg)   SpO2 97%   BMI 35.46 kg/m  Vitals:   04/05/18 0902  BP: 138/82  Pulse: 70  Resp: 16  Temp: 98.7 F (37.1 C)  SpO2: 97%  Weight: 299 lb (135.6 kg)  Height: 6\' 5"   (1.956 m)     Physical Exam  Constitutional: He is oriented to person, place, and time. He appears well-developed and well-nourished.  HENT:  Right Ear: External ear normal.  Left Ear: External ear normal.  Right canal swollen and edematous.   Cardiovascular: Normal rate.  Pulmonary/Chest: Effort normal.  Neurological: He is alert and oriented to person, place, and time.  Skin: Skin is warm and dry.  Psychiatric: He has a normal mood and affect. His behavior is normal.        Assessment & Plan:     1. Acute swimmer's ear of right side  - neomycin-polymyxin-hydrocortisone (CORTISPORIN) OTIC solution; Place 4 drops into the right ear 4 (four) times daily.  Dispense: 10 mL; Refill: 0  Return if symptoms worsen or fail to improve.  The entirety of the information documented in the History of Present Illness, Review of Systems and Physical Exam were personally obtained by me. Portions of this information were initially documented by Wilburt Finlay, CMA and reviewed by me for thoroughness and accuracy.           Trinna Post, PA-C  [facility]

## 2018-04-05 NOTE — Patient Instructions (Signed)
Otitis Externa Otitis externa is an infection of the outer ear canal. The outer ear canal is the area between the outside of the ear and the eardrum. Otitis externa is sometimes called "swimmer's ear." Follow these instructions at home:  If you were given antibiotic ear drops, use them as told by your doctor. Do not stop using them even if your condition gets better.  Take over-the-counter and prescription medicines only as told by your doctor.  Keep all follow-up visits as told by your doctor. This is important. How is this prevented?  Keep your ear dry. Use the corner of a towel to dry your ear after you swim or bathe.  Try not to scratch or put things in your ear. Doing these things makes it easier for germs to grow in your ear.  Avoid swimming in lakes, dirty water, or pools that may not have the right amount of a chemical called chlorine.  Consider making ear drops and putting 3 or 4 drops in each ear after you swim. Ask your doctor about how you can make ear drops. Contact a doctor if:  You have a fever.  After 3 days your ear is still red, swollen, or painful.  After 3 days you still have pus coming from your ear.  Your redness, swelling, or pain gets worse.  You have a really bad headache.  You have redness, swelling, pain, or tenderness behind your ear. This information is not intended to replace advice given to you by your health care provider. Make sure you discuss any questions you have with your health care provider. Document Released: 01/17/2008 Document Revised: 08/26/2015 Document Reviewed: 05/10/2015 Elsevier Interactive Patient Education  2018 Elsevier Inc.  

## 2018-06-21 ENCOUNTER — Ambulatory Visit: Payer: PRIVATE HEALTH INSURANCE | Admitting: Physician Assistant

## 2018-06-21 ENCOUNTER — Encounter: Payer: Self-pay | Admitting: Physician Assistant

## 2018-06-21 VITALS — BP 130/100 | HR 54 | Temp 97.8°F | Resp 16 | Wt 300.0 lb

## 2018-06-21 DIAGNOSIS — Z0000 Encounter for general adult medical examination without abnormal findings: Secondary | ICD-10-CM | POA: Diagnosis not present

## 2018-06-21 DIAGNOSIS — M26609 Unspecified temporomandibular joint disorder, unspecified side: Secondary | ICD-10-CM

## 2018-06-21 DIAGNOSIS — R29818 Other symptoms and signs involving the nervous system: Secondary | ICD-10-CM

## 2018-06-21 NOTE — Progress Notes (Signed)
Patient: Edgar Martinez Male    DOB: 05-03-1986   32 y.o.   MRN: [id number] Visit Date: 06/25/2018  Today's Provider: Trinna Post, PA-C   Chief Complaint  Patient presents with  . Ear Pain   Subjective:    HPI  CPE  No history of prostate or colon cancer. UTD tetanus and flu.   Patient here today c/o persistent right ear pain. Pain radiates to his jaw. Patient reports pain did not improve with cortisporin drops. Patient was seen on 04/05/18 for acute swimmer's ear.   Also reports daytime fatigue and drowsiness. Reports snoring when he sleeps.      No Known Allergies   Current Outpatient Medications:  .  losartan-hydrochlorothiazide (HYZAAR) 100-25 MG tablet, Take 1 tablet by mouth daily., Disp: 90 tablet, Rfl: 1 .  neomycin-polymyxin-hydrocortisone (CORTISPORIN) OTIC solution, Place 4 drops into the right ear 4 (four) times daily., Disp: 10 mL, Rfl: 0 .  pantoprazole (PROTONIX) 40 MG tablet, TAKE 1 TABLET BY MOUTH ONCE DAILY, Disp: 30 tablet, Rfl: 0 .  sildenafil (REVATIO) 20 MG tablet, 1-5 tablets (20 - 100 mg) 1 hour prior to intercourse. (Patient not taking: Reported on 02/20/2018), Disp: 30 tablet, Rfl: 0  Review of Systems  Constitutional: Negative.   HENT: Positive for ear pain.     Social History   Tobacco Use  . Smoking status: Never Smoker  . Smokeless tobacco: Never Used  Substance Use Topics  . Alcohol use: Yes   Objective:   BP (!) 130/100 (BP Location: Left Arm, Patient Position: Sitting, Cuff Size: Large)   Pulse (!) 54   Temp 97.8 F (36.6 C) (Oral)   Resp 16   Wt 300 lb (136.1 kg)   SpO2 96%   BMI 35.57 kg/m  Vitals:   06/21/18 0816  BP: (!) 130/100  Pulse: (!) 54  Resp: 16  Temp: 97.8 F (36.6 C)  TempSrc: Oral  SpO2: 96%  Weight: 300 lb (136.1 kg)     Physical Exam  Constitutional: He is oriented to person, place, and time. He appears well-developed and well-nourished.  HENT:  Right Ear: Tympanic membrane, external  ear and ear canal normal.  Left Ear: Tympanic membrane, external ear and ear canal normal.  Cardiovascular: Normal rate and regular rhythm.  Pulmonary/Chest: Effort normal and breath sounds normal.  Abdominal: Soft. Bowel sounds are normal.  Neurological: He is alert and oriented to person, place, and time.  Skin: Skin is warm and dry.  Psychiatric: He has a normal mood and affect. His behavior is normal.   Results of the Epworth flowsheet 06/21/2018  Sitting and reading 2  Watching TV 2  Sitting, inactive in a public place (e.g. a theatre or a meeting) 0  As a passenger in a car for an hour without a break 1  Lying down to rest in the afternoon when circumstances permit 2  Sitting and talking to someone 0  Sitting quietly after a lunch without alcohol 1  In a car, while stopped for a few minutes in traffic 0  Total score 8        Assessment & Plan:     1. 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 Recommend anti-inflammatories, mouthguard at night.  3. Suspected sleep apnea  Placed order for sleep study today.  Return if symptoms worsen or fail to improve.  The entirety of the information documented in the History of Present  Illness, Review of Systems and Physical Exam were personally obtained by me. Portions of this information were initially documented by Lynford Humphrey, CMA and reviewed by me for thoroughness and accuracy.           Trinna Post, PA-C  [facility]

## 2018-06-21 NOTE — Patient Instructions (Signed)
Ibuprofen 800 mg TID x 7 days.    Temporomandibular Joint Syndrome Temporomandibular joint (TMJ) syndrome is a condition that affects the joints between your jaw and your skull. The TMJs are located near your ears and allow your jaw to open and close. These joints and the nearby muscles are involved in all movements of the jaw. People with TMJ syndrome have pain in the area of these joints and muscles. Chewing, biting, or other movements of the jaw can be difficult or painful. TMJ syndrome can be caused by various things. In many cases, the condition is mild and goes away within a few weeks. For some people, the condition can become a long-term problem. What are the causes? Possible causes of TMJ syndrome include:  Grinding your teeth or clenching your jaw. Some people do this when they are under stress.  Arthritis.  Injury to the jaw.  Head or neck injury.  Teeth or dentures that are not aligned well.  In some cases, the cause of TMJ syndrome may not be known. What are the signs or symptoms? The most common symptom is an aching pain on the side of the head in the area of the TMJ. Other symptoms may include:  Pain when moving your jaw, such as when chewing or biting.  Being unable to open your jaw all the way.  Making a clicking sound when you open your mouth.  Headache.  Earache.  Neck or shoulder pain.  How is this diagnosed? Diagnosis can usually be made based on your symptoms, your medical history, and a physical exam. Your health care provider may check the range of motion of your jaw. Imaging tests, such as X-rays or an MRI, are sometimes done. You may need to see your dentist to determine if your teeth and jaw are lined up correctly. How is this treated? TMJ syndrome often goes away on its own. If treatment is needed, the options may include:  Eating soft foods and applying ice or heat.  Medicines to relieve pain or inflammation.  Medicines to relax the  muscles.  A splint, bite plate, or mouthpiece to prevent teeth grinding or jaw clenching.  Relaxation techniques or counseling to help reduce stress.  Transcutaneous electrical nerve stimulation (TENS). This helps to relieve pain by applying an electrical current through the skin.  Acupuncture. This is sometimes helpful to relieve pain.  Jaw surgery. This is rarely needed.  Follow these instructions at home:  Take medicines only as directed by your health care provider.  Eat a soft diet if you are having trouble chewing.  Apply ice to the painful area. ? Put ice in a plastic bag. ? Place a towel between your skin and the bag. ? Leave the ice on for 20 minutes, 2-3 times a day.  Apply a warm compress to the painful area as directed.  Massage your jaw area and perform any jaw stretching exercises as recommended by your health care provider.  If you were given a mouthpiece or bite plate, wear it as directed.  Avoid foods that require a lot of chewing. Do not chew gum.  Keep all follow-up visits as directed by your health care provider. This is important. Contact a health care provider if:  You are having trouble eating.  You have new or worsening symptoms. Get help right away if:  Your jaw locks open or closed. This information is not intended to replace advice given to you by your health care provider. Make sure you discuss  any questions you have with your health care provider. Document Released: 04/25/2001 Document Revised: 03/30/2016 Document Reviewed: 03/05/2014 Elsevier Interactive Patient Education  [employer].

## 2018-06-26 NOTE — Addendum Note (Signed)
Addended by: Trinna Post on: 06/26/2018 09:19 AM   Modules accepted: Level of Service

## 2018-07-03 ENCOUNTER — Other Ambulatory Visit: Payer: Self-pay | Admitting: Physician Assistant

## 2018-07-03 DIAGNOSIS — K219 Gastro-esophageal reflux disease without esophagitis: Secondary | ICD-10-CM

## 2018-08-02 ENCOUNTER — Ambulatory Visit (INDEPENDENT_AMBULATORY_CARE_PROVIDER_SITE_OTHER): Payer: PRIVATE HEALTH INSURANCE | Admitting: Physician Assistant

## 2018-08-02 ENCOUNTER — Encounter: Payer: Self-pay | Admitting: Physician Assistant

## 2018-08-02 VITALS — BP 137/88 | HR 90 | Temp 98.2°F | Wt 299.2 lb

## 2018-08-02 DIAGNOSIS — N529 Male erectile dysfunction, unspecified: Secondary | ICD-10-CM

## 2018-08-02 DIAGNOSIS — J0100 Acute maxillary sinusitis, unspecified: Secondary | ICD-10-CM

## 2018-08-02 MED ORDER — AMOXICILLIN 875 MG PO TABS: 875.0000 mg | ORAL_TABLET | Freq: Two times a day (BID) | ORAL | 0 refills | Status: AC

## 2018-08-02 MED ORDER — SILDENAFIL CITRATE 20 MG PO TABS: ORAL_TABLET | ORAL | 0 refills | Status: DC

## 2018-08-02 MED ORDER — BENZONATATE 100 MG PO CAPS: 100.0000 mg | ORAL_CAPSULE | Freq: Three times a day (TID) | ORAL | 0 refills | Status: AC | PRN

## 2018-08-02 NOTE — Patient Instructions (Signed)

## 2018-08-02 NOTE — Progress Notes (Signed)
Patient: Edgar Martinez Male    DOB: Apr 30, 1986   32 y.o.   MRN: [id number] Visit Date: 08/02/2018  Today's Provider: Trinna Post, PA-C   Chief Complaint  Patient presents with  . URI   Subjective:     URI   This is a new problem. Episode onset: 8 days ago. The problem has been gradually worsening. There has been no fever. Associated symptoms include congestion, coughing, rhinorrhea, sinus pain and a sore throat. Treatments tried: Zicam, NyQuil, Ibuprofen  The treatment provided mild relief.   Reports he is experiencing erectile dysfunction. Having some issues with wife undergoing a lot of stress. Previously had similar issues and was prescribed sildenafil which did work but he only ended up taking two because the situation calmed down.  No Known Allergies   Current Outpatient Medications:  .  losartan-hydrochlorothiazide (HYZAAR) 100-25 MG tablet, Take 1 tablet by mouth daily., Disp: 90 tablet, Rfl: 1 .  pantoprazole (PROTONIX) 40 MG tablet, TAKE 1 TABLET BY MOUTH EVERY DAY, Disp: 90 tablet, Rfl: 0 .  amoxicillin (AMOXIL) 875 MG tablet, Take 1 tablet (875 mg total) by mouth 2 (two) times daily for 7 days., Disp: 14 tablet, Rfl: 0 .  benzonatate (TESSALON PERLES) 100 MG capsule, Take 1 capsule (100 mg total) by mouth 3 (three) times daily as needed for up to 7 days., Disp: 21 capsule, Rfl: 0 .  sildenafil (REVATIO) 20 MG tablet, 1-5 tablets (20 - 100 mg) 1 hour prior to intercourse., Disp: 30 tablet, Rfl: 0  Review of Systems  Constitutional: Negative.   HENT: Positive for congestion, rhinorrhea, sinus pain and sore throat.   Respiratory: Positive for cough.   Cardiovascular: Negative.   Musculoskeletal: Negative.     Social History   Tobacco Use  . Smoking status: Never Smoker  . Smokeless tobacco: Never Used  Substance Use Topics  . Alcohol use: Yes      Objective:   BP 137/88 (BP Location: Right Arm, Patient Position: Sitting, Cuff Size: Large)   Pulse  90   Temp 98.2 F (36.8 C) (Oral)   Wt 299 lb 3.2 oz (135.7 kg)   SpO2 96%   BMI 35.48 kg/m  Vitals:   08/02/18 1102  BP: 137/88  Pulse: 90  Temp: 98.2 F (36.8 C)  TempSrc: Oral  SpO2: 96%  Weight: 299 lb 3.2 oz (135.7 kg)     Physical Exam Constitutional:      General: He is not in acute distress.    Appearance: He is well-developed. He is not diaphoretic.  HENT:     Right Ear: External ear normal.     Left Ear: External ear normal.     Nose:     Right Sinus: Maxillary sinus tenderness and frontal sinus tenderness present.     Left Sinus: Maxillary sinus tenderness and frontal sinus tenderness present.     Mouth/Throat:     Pharynx: No oropharyngeal exudate or posterior oropharyngeal erythema.     Tonsils: No tonsillar abscesses.  Eyes:     Conjunctiva/sclera: Conjunctivae normal.  Neck:     Musculoskeletal: Normal range of motion.  Cardiovascular:     Rate and Rhythm: Normal rate and regular rhythm.  Pulmonary:     Effort: Pulmonary effort is normal.     Breath sounds: Normal breath sounds.  Skin:    General: Skin is warm and dry.  Neurological:     Mental Status: He is alert and  oriented to person, place, and time.  Psychiatric:        Behavior: Behavior normal.         Assessment & Plan    1. Acute non-recurrent maxillary sinusitis  - amoxicillin (AMOXIL) 875 MG tablet; Take 1 tablet (875 mg total) by mouth 2 (two) times daily for 7 days.  Dispense: 14 tablet; Refill: 0 - benzonatate (TESSALON PERLES) 100 MG capsule; Take 1 capsule (100 mg total) by mouth 3 (three) times daily as needed for up to 7 days.  Dispense: 21 capsule; Refill: 0  2. Erectile dysfunction, unspecified erectile dysfunction type  Suspect this may be due more to stress but will refill as below. Precautions given.   - sildenafil (REVATIO) 20 MG tablet; 1-5 tablets (20 - 100 mg) 1 hour prior to intercourse.  Dispense: 30 tablet; Refill: 0  Return if symptoms worsen or fail to  improve.  The entirety of the information documented in the History of Present Illness, Review of Systems and Physical Exam were personally obtained by me. Portions of this information were initially documented by Idelle Jo, CMA and reviewed by me for thoroughness and accuracy.         Trinna Post, PA-C  [facility]

## 2018-08-14 ENCOUNTER — Encounter: Payer: Self-pay | Admitting: Physician Assistant

## 2018-08-14 DIAGNOSIS — N529 Male erectile dysfunction, unspecified: Secondary | ICD-10-CM

## 2018-08-15 MED ORDER — SILDENAFIL CITRATE 20 MG PO TABS: ORAL_TABLET | ORAL | 0 refills | Status: DC

## 2018-08-22 ENCOUNTER — Telehealth: Payer: Self-pay

## 2018-08-22 ENCOUNTER — Other Ambulatory Visit: Payer: Self-pay | Admitting: Physician Assistant

## 2018-08-22 DIAGNOSIS — I10 Essential (primary) hypertension: Secondary | ICD-10-CM

## 2018-08-22 NOTE — Telephone Encounter (Signed)
Left message for patient to call office back. Sleep study report showed that patient did not have sleep apnea. KW

## 2018-08-22 NOTE — Telephone Encounter (Signed)
Patient advised.KW 

## 2018-08-26 ENCOUNTER — Encounter: Payer: Self-pay | Admitting: Physician Assistant

## 2018-08-26 ENCOUNTER — Ambulatory Visit: Payer: PRIVATE HEALTH INSURANCE | Admitting: Physician Assistant

## 2018-08-26 VITALS — BP 129/86 | HR 80 | Temp 97.7°F | Resp 16 | Wt 301.0 lb

## 2018-08-26 DIAGNOSIS — F419 Anxiety disorder, unspecified: Secondary | ICD-10-CM

## 2018-08-26 DIAGNOSIS — I10 Essential (primary) hypertension: Secondary | ICD-10-CM | POA: Diagnosis not present

## 2018-08-26 DIAGNOSIS — K219 Gastro-esophageal reflux disease without esophagitis: Secondary | ICD-10-CM | POA: Diagnosis not present

## 2018-08-26 MED ORDER — DEXLANSOPRAZOLE 30 MG PO CPDR: 30.0000 mg | DELAYED_RELEASE_CAPSULE | Freq: Every day | ORAL | 0 refills | Status: DC

## 2018-08-26 MED ORDER — CITALOPRAM HYDROBROMIDE 20 MG PO TABS: 20.0000 mg | ORAL_TABLET | Freq: Every day | ORAL | 0 refills | Status: DC

## 2018-08-26 NOTE — Progress Notes (Signed)
Patient: Edgar Martinez Male    DOB: 1986-05-15   33 y.o.   MRN: [id number] Visit Date: 08/27/2018  Today's Provider: Trinna Post, PA-C   Chief Complaint  Patient presents with  . Follow-up   Subjective:     HPI  Follow up for hypertension  The patient was last seen for this 1 months ago. Changes made at last visit include no changes.  He reports excellent compliance with treatment. He feels that condition is Improved. He is not having side effects.   Currently taking Losartan HCTZ - 100-25 mg daily with good results. Denies side effects.   Wt Readings from Last 3 Encounters:  08/26/18 (!) 301 lb (136.5 kg)  08/02/18 299 lb 3.2 oz (135.7 kg)  06/21/18 300 lb (136.1 kg)   BP Readings from Last 3 Encounters:  08/26/18 129/86  08/02/18 137/88  06/21/18 (!) 130/100   Erectile Dysfunction: He has been able to get affordable sildenafil at [facility] with good Rx. Reports he has had some success and some failure using this medication.  Insomnia: Continues to have trouble sleeping. Sleep study was negative for sleep apnea. Cites job stresses. Did have success previously on Celexa for anxiety.   GERD: Reports worsening reflux symptoms at night. Is taking 40 mg protonix nightly. Was taking zantac on top of this.  ------------------------------------------------------------------------------------    No Known Allergies   Current Outpatient Medications:  .  losartan-hydrochlorothiazide (HYZAAR) 100-25 MG tablet, TAKE 1 TABLET BY MOUTH DAILY, Disp: 90 tablet, Rfl: 1 .  pantoprazole (PROTONIX) 40 MG tablet, TAKE 1 TABLET BY MOUTH EVERY DAY, Disp: 90 tablet, Rfl: 0 .  sildenafil (REVATIO) 20 MG tablet, 1-5 tablets (20 - 100 mg) 1 hour prior to intercourse., Disp: 30 tablet, Rfl: 0 .  citalopram (CELEXA) 20 MG tablet, Take 1 tablet (20 mg total) by mouth daily., Disp: 90 tablet, Rfl: 0 .  Dexlansoprazole (DEXILANT) 30 MG capsule, Take 1 capsule (30 mg total) by  mouth daily., Disp: 90 capsule, Rfl: 0  Review of Systems  Constitutional: Negative.   Respiratory: Negative.   Cardiovascular: Negative.     Social History   Tobacco Use  . Smoking status: Never Smoker  . Smokeless tobacco: Never Used  Substance Use Topics  . Alcohol use: Yes      Objective:   BP 129/86 (BP Location: Left Arm, Patient Position: Sitting, Cuff Size: Normal)   Pulse 80   Temp 97.7 F (36.5 C) (Oral)   Resp 16   Wt (!) 301 lb (136.5 kg)   BMI 35.69 kg/m  Vitals:   08/26/18 1559  BP: 129/86  Pulse: 80  Resp: 16  Temp: 97.7 F (36.5 C)  TempSrc: Oral  Weight: (!) 301 lb (136.5 kg)     Physical Exam Constitutional:      Appearance: Normal appearance.  Cardiovascular:     Rate and Rhythm: Normal rate and regular rhythm.     Heart sounds: Normal heart sounds.  Pulmonary:     Effort: Pulmonary effort is normal.     Breath sounds: Normal breath sounds.  Skin:    General: Skin is warm and dry.  Neurological:     General: No focal deficit present.     Mental Status: He is alert and oriented to person, place, and time.  Psychiatric:        Mood and Affect: Mood normal.        Behavior: Behavior normal.  Assessment & Plan    1. Essential hypertension  Continue losartan-HCTZ 100-25 mg daily.   - Comprehensive Metabolic Panel (CMET)  2. Gastroesophageal reflux disease without esophagitis  Change to dexilant, counseled on weight loss and avoiding trigger foods.  - Dexlansoprazole (DEXILANT) 30 MG capsule; Take 1 capsule (30 mg total) by mouth daily.  Dispense: 90 capsule; Refill: 0  3. Anxiety Worsening, likely contributing to insomnia. Will restart 20 mg daily. If this causes sexual side effects, plant to switch to buspar.   - citalopram (CELEXA) 20 MG tablet; Take 1 tablet (20 mg total) by mouth daily.  Dispense: 90 tablet; Refill: 0   Return in about 3 months (around 11/25/2018) for HTN, Anxiety .  The entirety of the  information documented in the History of Present Illness, Review of Systems and Physical Exam were personally obtained by me. Portions of this information were initially documented by Lynford Humphrey, CMA and reviewed by me for thoroughness and accuracy.      Trinna Post, PA-C  [facility]

## 2018-08-26 NOTE — Patient Instructions (Signed)

## 2018-08-27 DIAGNOSIS — F419 Anxiety disorder, unspecified: Secondary | ICD-10-CM | POA: Insufficient documentation

## 2018-09-03 ENCOUNTER — Encounter: Payer: Self-pay | Admitting: Physician Assistant

## 2018-09-04 ENCOUNTER — Other Ambulatory Visit: Payer: Self-pay | Admitting: Physician Assistant

## 2018-09-04 DIAGNOSIS — F419 Anxiety disorder, unspecified: Secondary | ICD-10-CM

## 2018-09-04 MED ORDER — BUSPIRONE HCL 7.5 MG PO TABS: 7.5000 mg | ORAL_TABLET | Freq: Two times a day (BID) | ORAL | 0 refills | Status: DC

## 2018-09-04 NOTE — Progress Notes (Signed)
Buspar 75 mg BID to replace celexa.

## 2018-11-28 ENCOUNTER — Other Ambulatory Visit: Payer: Self-pay | Admitting: Physician Assistant

## 2018-11-28 DIAGNOSIS — K219 Gastro-esophageal reflux disease without esophagitis: Secondary | ICD-10-CM

## 2019-01-24 ENCOUNTER — Other Ambulatory Visit: Payer: Self-pay | Admitting: Physician Assistant

## 2019-01-24 ENCOUNTER — Encounter: Payer: Self-pay | Admitting: Physician Assistant

## 2019-01-24 DIAGNOSIS — G4700 Insomnia, unspecified: Secondary | ICD-10-CM

## 2019-01-24 DIAGNOSIS — F419 Anxiety disorder, unspecified: Secondary | ICD-10-CM

## 2019-01-27 MED ORDER — BUSPIRONE HCL 7.5 MG PO TABS: 7.5000 mg | ORAL_TABLET | Freq: Two times a day (BID) | ORAL | 0 refills | Status: DC

## 2019-01-27 NOTE — Telephone Encounter (Signed)
L.O.V. 08/26/2018, please advise.

## 2019-01-28 MED ORDER — TRAZODONE HCL 50 MG PO TABS: 25.0000 mg | ORAL_TABLET | Freq: Every evening | ORAL | 0 refills | Status: DC | PRN

## 2019-01-28 NOTE — Addendum Note (Signed)
Addended by: Trinna Post on: 01/28/2019 04:38 PM   Modules accepted: Orders

## 2019-02-24 ENCOUNTER — Other Ambulatory Visit: Payer: Self-pay

## 2019-02-24 ENCOUNTER — Encounter: Payer: Self-pay | Admitting: Physician Assistant

## 2019-02-24 ENCOUNTER — Ambulatory Visit (INDEPENDENT_AMBULATORY_CARE_PROVIDER_SITE_OTHER): Payer: PRIVATE HEALTH INSURANCE | Admitting: Physician Assistant

## 2019-02-24 VITALS — BP 147/87 | HR 73 | Temp 97.7°F | Resp 16 | Ht 77.0 in | Wt 299.0 lb

## 2019-02-24 DIAGNOSIS — E66812 Obesity, class 2: Secondary | ICD-10-CM

## 2019-02-24 DIAGNOSIS — G4700 Insomnia, unspecified: Secondary | ICD-10-CM

## 2019-02-24 DIAGNOSIS — K219 Gastro-esophageal reflux disease without esophagitis: Secondary | ICD-10-CM | POA: Diagnosis not present

## 2019-02-24 DIAGNOSIS — Z6836 Body mass index (BMI) 36.0-36.9, adult: Secondary | ICD-10-CM

## 2019-02-24 DIAGNOSIS — F419 Anxiety disorder, unspecified: Secondary | ICD-10-CM | POA: Diagnosis not present

## 2019-02-24 MED ORDER — TRAZODONE HCL 100 MG PO TABS: 100.0000 mg | ORAL_TABLET | Freq: Every day | ORAL | 1 refills | Status: DC

## 2019-02-24 MED ORDER — DEXILANT 30 MG PO CPDR: DELAYED_RELEASE_CAPSULE | ORAL | 3 refills | Status: DC

## 2019-02-24 NOTE — Progress Notes (Signed)
Patient: Edgar Martinez Male    DOB: [birth year]   33 y.o.   MRN: [id number] Visit Date: 03/03/2019  Today's Provider: Trinna Post, PA-C   No chief complaint on file.  Subjective:     HPI  Follow up for hypertension  The patient was last seen for this 6 months ago. Changes made at last visit include no changes.  He reports excellent compliance with treatment.  He feels that condition is Improved. He is not having side effects.   Currently taking losartan 100-25mg  QD without issue. Typically well controlled though today slightly high.   BP Readings from Last 3 Encounters:  02/24/19 (!) 147/87  08/26/18 129/86  08/02/18 137/88   Wt Readings from Last 3 Encounters:  02/24/19 299 lb (135.6 kg)  08/26/18 (!) 301 lb (136.5 kg)  08/02/18 299 lb 3.2 oz (135.7 kg)     ------------------------------------------------------------------------------------  Follow up for anxiety  The patient was last seen for this 6 months ago. Changes made at last visit include restart citalopram 20 mg daily. Patient was having side effects. Medication was change to Buspar 7.5 mg.  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 Trying to exercise more and not getting fast food as much. Has been sleeping poorly. Is about to have a few weeks off from work and is sleeping better.   He reports excellent compliance with treatment. He feels that condition is Unchanged. He is not having side effects.   Depression screen [facility] 2/9 02/24/2019 08/24/2017  Decreased Interest 0 0  Down, Depressed, Hopeless 2 0  PHQ - 2 Score 2 0  Altered sleeping 2 1  Tired, decreased energy 2 2  Change in appetite 1 0  Feeling bad or failure about yourself  1 0  Trouble concentrating 0 0  Moving slowly or fidgety/restless 0 0  Suicidal thoughts 0 0  PHQ-9 Score 8 3  Difficult doing work/chores Somewhat difficult Not difficult at all   GERD: feels the dexilant is working well.    -----------------------------------------------------------------------------------   No Known Allergies   Current Outpatient Medications:  .  busPIRone (BUSPAR) 7.5 MG tablet, Take 1 tablet (7.5 mg total) by mouth 2 (two) times daily., Disp: 180 tablet, Rfl: 0 .  Dexlansoprazole (DEXILANT) 30 MG capsule, TAKE 1 CAPSULE(30 MG) BY MOUTH DAILY, Disp: 90 capsule, Rfl: 3 .  losartan-hydrochlorothiazide (HYZAAR) 100-25 MG tablet, TAKE 1 TABLET BY MOUTH DAILY, Disp: 90 tablet, Rfl: 1 .  pantoprazole (PROTONIX) 40 MG tablet, TAKE 1 TABLET BY MOUTH EVERY DAY, Disp: 90 tablet, Rfl: 0 .  sildenafil (REVATIO) 20 MG tablet, 1-5 tablets (20 - 100 mg) 1 hour prior to intercourse., Disp: 30 tablet, Rfl: 0 .  traZODone (DESYREL) 100 MG tablet, Take 1 tablet (100 mg total) by mouth at bedtime., Disp: 90 tablet, Rfl: 1  Review of Systems  Constitutional: Negative.   Psychiatric/Behavioral: The patient is nervous/anxious.     Social History   Tobacco Use  . Smoking status: Never Smoker  . Smokeless tobacco: Never Used  Substance Use Topics  . Alcohol use: Yes      Objective:   BP (!) 147/87 (BP Location: Left Arm, Patient Position: Sitting, Cuff Size: Large)   Pulse 73   Temp 97.7 F (36.5 C) (Oral)   Resp 16   Ht 6\' 5"  (1.956 m)   Wt 299 lb (135.6 kg)   SpO2 97%   BMI 35.46 kg/m  Vitals:   02/24/19 1512  BP: (!) 147/87  Pulse: 73  Resp: 16  Temp: 97.7 F (36.5 C)  TempSrc: Oral  SpO2: 97%  Weight: 299 lb (135.6 kg)  Height: 6\' 5"  (1.956 m)     Physical Exam Constitutional:      Appearance: Normal appearance.  Cardiovascular:     Rate and Rhythm: Normal rate and regular rhythm.     Heart sounds: Normal heart sounds.  Pulmonary:     Breath sounds: Normal breath sounds.  Skin:    General: Skin is warm and dry.  Neurological:     Mental Status: He is alert and oriented to person, place, and time. Mental status is at baseline.  Psychiatric:        Mood and Affect: Mood  normal.        Behavior: Behavior normal.      No results found for any visits on 02/24/19.     Assessment & Plan    1. Gastroesophageal reflux disease without esophagitis  - Dexlansoprazole (DEXILANT) 30 MG capsule; TAKE 1 CAPSULE(30 MG) BY MOUTH DAILY  Dispense: 90 capsule; Refill: 3  2. 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 Will try as below for sleep. Does not want to pursue counseling right now.   - traZODone (DESYREL) 100 MG tablet; Take 1 tablet (100 mg total) by mouth at bedtime.  Dispense: 90 tablet; Refill: 1  3. Class 2 severe obesity with serious comorbidity and body mass index (BMI) of 36.0 to 36.9 in adult, unspecified obesity type [facility])  He is increasing his exercise and reducing fast food intake.  4. Anxiety  Continue buspar.  The entirety of the information documented in the History of Present Illness, Review of Systems and Physical Exam were personally obtained by me. Portions of this information were initially documented by Lynford Humphrey, CMA and reviewed by me for thoroughness and accuracy.   F/u 6 months    Trinna Post, PA-C  [facility]

## 2019-02-24 NOTE — Patient Instructions (Signed)

## 2019-02-26 ENCOUNTER — Other Ambulatory Visit: Payer: Self-pay | Admitting: Physician Assistant

## 2019-02-26 DIAGNOSIS — I10 Essential (primary) hypertension: Secondary | ICD-10-CM

## 2019-03-03 DIAGNOSIS — Z6836 Body mass index (BMI) 36.0-36.9, adult: Secondary | ICD-10-CM | POA: Insufficient documentation

## 2019-03-28 ENCOUNTER — Encounter: Payer: Self-pay | Admitting: Physician Assistant

## 2019-03-28 DIAGNOSIS — R112 Nausea with vomiting, unspecified: Secondary | ICD-10-CM

## 2019-03-28 MED ORDER — ONDANSETRON HCL 4 MG PO TABS: 4.0000 mg | ORAL_TABLET | Freq: Three times a day (TID) | ORAL | 0 refills | Status: DC | PRN

## 2019-05-15 ENCOUNTER — Other Ambulatory Visit: Payer: Self-pay | Admitting: Physician Assistant

## 2019-05-15 DIAGNOSIS — R112 Nausea with vomiting, unspecified: Secondary | ICD-10-CM

## 2019-05-15 DIAGNOSIS — F419 Anxiety disorder, unspecified: Secondary | ICD-10-CM

## 2019-05-15 MED ORDER — BUSPIRONE HCL 7.5 MG PO TABS: 7.5000 mg | ORAL_TABLET | Freq: Two times a day (BID) | ORAL | 0 refills | Status: DC

## 2019-05-15 MED ORDER — ONDANSETRON HCL 4 MG PO TABS: 4.0000 mg | ORAL_TABLET | Freq: Three times a day (TID) | ORAL | 0 refills | Status: DC | PRN

## 2019-05-15 NOTE — Telephone Encounter (Signed)
[facility] faxed refill request for the following medications:  ondansetron (ZOFRAN) 4 MG tablet  Please advise.  Thanks, [employer]

## 2019-05-15 NOTE — Telephone Encounter (Signed)
[facility] faxed refill request for the following medications:  busPIRone (BUSPAR) 7.5 MG tablet   Please advise.

## 2019-07-04 ENCOUNTER — Other Ambulatory Visit: Payer: Self-pay

## 2019-07-04 ENCOUNTER — Encounter: Payer: Self-pay | Admitting: Physician Assistant

## 2019-07-04 ENCOUNTER — Ambulatory Visit (INDEPENDENT_AMBULATORY_CARE_PROVIDER_SITE_OTHER): Payer: PRIVATE HEALTH INSURANCE | Admitting: Physician Assistant

## 2019-07-04 VITALS — BP 144/95 | HR 65 | Temp 96.9°F | Ht 77.0 in | Wt 298.2 lb

## 2019-07-04 DIAGNOSIS — Z0000 Encounter for general adult medical examination without abnormal findings: Secondary | ICD-10-CM

## 2019-07-04 DIAGNOSIS — F419 Anxiety disorder, unspecified: Secondary | ICD-10-CM

## 2019-07-04 DIAGNOSIS — I10 Essential (primary) hypertension: Secondary | ICD-10-CM | POA: Diagnosis not present

## 2019-07-04 MED ORDER — SPIRONOLACTONE 25 MG PO TABS: 25.0000 mg | ORAL_TABLET | Freq: Every day | ORAL | 0 refills | Status: DC

## 2019-07-04 MED ORDER — BUSPIRONE HCL 15 MG PO TABS: 15.0000 mg | ORAL_TABLET | Freq: Two times a day (BID) | ORAL | 0 refills | Status: AC

## 2019-07-04 NOTE — Patient Instructions (Signed)

## 2019-07-04 NOTE — Progress Notes (Signed)
Patient: Edgar Martinez, Male    DOB: [birth year], 33 y.o.   MRN: [id number] Visit Date: 07/04/2019  Today's Provider: Trinna Post, PA-C   Chief Complaint  Patient presents with  . Annual Exam   Subjective:    Annual physical exam Edgar Martinez is a 33 y.o. male who presents today for health maintenance and complete physical. He feels well. He reports exercising some. He reports he is sleeping poorly.  HTN: Currently on Hyzaar 100-25 mg daily and takes this consistently. Previously discontinued amlodipine due to pedal edema. Remotely on metoprolol but stopped as he was going through [employer] and it was lowering his HR.   BP Readings from Last 3 Encounters:  07/04/19 (!) 144/95  02/24/19 (!) 147/87  08/26/18 129/86   Working on losing weight, limiting to two sodas daily.   Wt Readings from Last 3 Encounters:  07/04/19 298 lb 3.2 oz (135.3 kg)  02/24/19 299 lb (135.6 kg)  08/26/18 (!) 301 lb (136.5 kg)   Pulse Readings from Last 3 Encounters:  07/04/19 65  02/24/19 73  08/26/18 80   Anxiety: Currently on buspar 7.5 mg BID. Would like to increase this. He is going through pastoral counseling which has been somewhat helpful to him.   -----------------------------------------------------------------   Review of Systems  Constitutional: Negative.   HENT: Negative.   Eyes: Negative.   Respiratory: Negative.   Cardiovascular: Negative.   Gastrointestinal: Negative.   Endocrine: Negative.   Genitourinary: Negative.   Musculoskeletal: Negative.   Skin: Negative.   Allergic/Immunologic: Negative.   Neurological: Negative.   Hematological: Negative.   Psychiatric/Behavioral: Positive for sleep disturbance. The patient is nervous/anxious.     Social History He  reports that he has never smoked. He has never used smokeless tobacco. He reports current alcohol use. He reports that he does not use drugs. Social History   Socioeconomic History  . Marital  status: Married    Spouse name: Not on file  . Number of children: Not on file  . Years of education: Not on file  . Highest education level: Not on file  Occupational History  . Not on file  Social Needs  . Financial resource strain: Not on file  . Food insecurity    Worry: Not on file    Inability: Not on file  . Transportation needs    Medical: Not on file    Non-medical: Not on file  Tobacco Use  . Smoking status: Never Smoker  . Smokeless tobacco: Never Used  Substance and Sexual Activity  . Alcohol use: Yes  . Drug use: No  . Sexual activity: Yes    Partners: Female  Lifestyle  . Physical activity    Days per week: Not on file    Minutes per session: Not on file  . Stress: Not on file  Relationships  . Social Herbalist on phone: Not on file    Gets together: Not on file    Attends religious service: Not on file    Active member of club or organization: Not on file    Attends meetings of clubs or organizations: Not on file    Relationship status: Not on file  Other Topics Concern  . Not on file  Social History Narrative  . Not on file    Patient Active Problem List   Diagnosis Date Noted  . Class 2 severe obesity with serious comorbidity and body mass  index (BMI) of 36.0 to 36.9 in adult [facility]) 03/03/2019  . Anxiety 08/27/2018  . GERD (gastroesophageal reflux disease) 11/19/2016  . Changing nevus 11/19/2016  . Annual physical exam 05/16/2016  . Essential hypertension 05/16/2016  . Erectile dysfunction 05/16/2016  . Essential (primary) hypertension 12/14/2011  . GERD (gastroesophageal reflux disease) 12/14/2011    Past Surgical History:  Procedure Laterality Date  . HERNIA REPAIR  2012   Bilateral inguinal hernia with mesh.  Marland Kitchen VASECTOMY     2015  . WRIST SURGERY  2015    Family History  Family Status  Relation Name Status  . Mother  (Not Specified)  . Father  (Not Specified)  . MGF  Deceased  . PGF  Deceased  . MGM  Deceased  . PGM   Deceased   His family history includes CVA in his maternal grandmother; Diabetes in his maternal grandfather; Heart attack in his maternal grandfather and maternal grandmother; Heart disease in his father; Hyperlipidemia in his father and mother; Hypertension in his father and mother; Hypothyroidism in his mother; Lung cancer in his paternal grandfather; Stomach cancer in his paternal grandmother.     No Known Allergies  Previous Medications   BUSPIRONE (BUSPAR) 7.5 MG TABLET    Take 1 tablet (7.5 mg total) by mouth 2 (two) times daily.   DEXLANSOPRAZOLE (DEXILANT) 30 MG CAPSULE    TAKE 1 CAPSULE(30 MG) BY MOUTH DAILY   LOSARTAN-HYDROCHLOROTHIAZIDE (HYZAAR) 100-25 MG TABLET    TAKE 1 TABLET BY MOUTH DAILY   ONDANSETRON (ZOFRAN) 4 MG TABLET    Take 1 tablet (4 mg total) by mouth every 8 (eight) hours as needed for nausea or vomiting.   SILDENAFIL (REVATIO) 20 MG TABLET    1-5 tablets (20 - 100 mg) 1 hour prior to intercourse.   TRAZODONE (DESYREL) 100 MG TABLET    Take 1 tablet (100 mg total) by mouth at bedtime.    Patient Care Team: Paulene Floor as PCP - General (Physician Assistant)      Objective:   Vitals: BP (!) 144/95 (BP Location: Left Arm, Patient Position: Sitting, Cuff Size: Large)   Pulse 65   Temp (!) 96.9 F (36.1 C) (Temporal)   Ht 6\' 5"  (1.956 m)   Wt 298 lb 3.2 oz (135.3 kg)   BMI 35.36 kg/m    Physical Exam Constitutional:      Appearance: Normal appearance. He is obese.  HENT:     Right Ear: Tympanic membrane and ear canal normal.     Left Ear: Tympanic membrane and ear canal normal.  Cardiovascular:     Rate and Rhythm: Normal rate and regular rhythm.     Heart sounds: Normal heart sounds.  Pulmonary:     Effort: Pulmonary effort is normal.     Breath sounds: Normal breath sounds.  Abdominal:     General: Bowel sounds are normal.     Palpations: Abdomen is soft.  Skin:    General: Skin is warm and dry.  Neurological:     Mental Status: He  is alert and oriented to person, place, and time. Mental status is at baseline.  Psychiatric:        Mood and Affect: Mood normal.        Behavior: Behavior normal.      Depression Screen PHQ 2/9 Scores 07/04/2019 02/24/2019 08/24/2017  PHQ - 2 Score 0 2 0  PHQ- 9 Score 2 8 3       Assessment &  Plan:     Routine Health Maintenance and Physical Exam  Exercise Activities and Dietary recommendations Goals   None     Immunization History  Administered Date(s) Administered  . Tdap 03/31/2013, 03/31/2016    Health Maintenance  Topic Date Due  . INFLUENZA VACCINE  03/15/2019  . TETANUS/TDAP  03/31/2026  . HIV Screening  Completed     Discussed health benefits of physical activity, and encouraged him to engage in regular exercise appropriate for his age and condition.    1. Annual physical exam  - Comprehensive Metabolic Panel (CMET) - CBC with Differential - Lipid Profile - TSH  2. Essential hypertension  Intolerance to amlodipine 2/2 pedal edema and metoprolol due to low HR. Will try to add spironolactone. Counseled on risk of hyperkalemia and that we will monitor labs closely. Follow up 6 weeks.   - spironolactone (ALDACTONE) 25 MG tablet; Take 1 tablet (25 mg total) by mouth daily.  Dispense: 90 tablet; Refill: 0  3. Anxiety  Increase as below. He is currently going through pastoral counseling and is planning on taking some time off work to help his mom with surgery and be with family for the holidays.   - busPIRone (BUSPAR) 15 MG tablet; Take 1 tablet (15 mg total) by mouth 2 (two) times daily.  Dispense: 180 tablet; Refill: 0  The entirety of the information documented in the History of Present Illness, Review of Systems and Physical Exam were personally obtained by me. Portions of this information were initially documented by [facility], CMA and reviewed by me for thoroughness and accuracy.   F/u 6 weeks for HTN and labs    --------------------------------------------------------------------

## 2019-07-05 LAB — CBC WITH DIFFERENTIAL/PLATELET
Basophils Absolute: 0 10*3/uL (ref 0.0–0.2)
Basos: 1 %
EOS (ABSOLUTE): 0.1 10*3/uL (ref 0.0–0.4)
Eos: 2 %
Hematocrit: 44.4 % (ref 37.5–51.0)
Hemoglobin: 14.5 g/dL (ref 13.0–17.7)
Immature Grans (Abs): 0 10*3/uL (ref 0.0–0.1)
Immature Granulocytes: 0 %
Lymphocytes Absolute: 1.7 10*3/uL (ref 0.7–3.1)
Lymphs: 32 %
MCH: 24.5 pg — ABNORMAL LOW (ref 26.6–33.0)
MCHC: 32.7 g/dL (ref 31.5–35.7)
MCV: 75 fL — ABNORMAL LOW (ref 79–97)
Monocytes Absolute: 0.5 10*3/uL (ref 0.1–0.9)
Monocytes: 9 %
Neutrophils Absolute: 3 10*3/uL (ref 1.4–7.0)
Neutrophils: 56 %
Platelets: 211 10*3/uL (ref 150–450)
RBC: 5.91 x10E6/uL — ABNORMAL HIGH (ref 4.14–5.80)
RDW: 13.9 % (ref 11.6–15.4)
WBC: 5.3 10*3/uL (ref 3.4–10.8)

## 2019-07-05 LAB — COMPREHENSIVE METABOLIC PANEL
ALT: 19 IU/L (ref 0–44)
AST: 21 IU/L (ref 0–40)
Albumin/Globulin Ratio: 1.9 (ref 1.2–2.2)
Albumin: 4.6 g/dL (ref 4.0–5.0)
Alkaline Phosphatase: 53 IU/L (ref 39–117)
BUN/Creatinine Ratio: 8 — ABNORMAL LOW (ref 9–20)
BUN: 12 mg/dL (ref 6–20)
Bilirubin Total: 0.5 mg/dL (ref 0.0–1.2)
CO2: 23 mmol/L (ref 20–29)
Calcium: 9.5 mg/dL (ref 8.7–10.2)
Chloride: 102 mmol/L (ref 96–106)
Creatinine, Ser: 1.48 mg/dL — ABNORMAL HIGH (ref 0.76–1.27)
GFR calc Af Amer: 70 mL/min/{1.73_m2} (ref >59)
GFR calc non Af Amer: 61 mL/min/{1.73_m2} (ref >59)
Globulin, Total: 2.4 g/dL (ref 1.5–4.5)
Glucose: 98 mg/dL (ref 65–99)
Potassium: 4.2 mmol/L (ref 3.5–5.2)
Sodium: 140 mmol/L (ref 134–144)
Total Protein: 7 g/dL (ref 6.0–8.5)

## 2019-07-05 LAB — LIPID PANEL
Chol/HDL Ratio: 5 ratio (ref 0.0–5.0)
Cholesterol, Total: 198 mg/dL (ref 100–199)
HDL: 40 mg/dL (ref >39)
LDL Chol Calc (NIH): 143 mg/dL — ABNORMAL HIGH (ref 0–99)
Triglycerides: 83 mg/dL (ref 0–149)
VLDL Cholesterol Cal: 15 mg/dL (ref 5–40)

## 2019-07-05 LAB — TSH: TSH: 0.833 u[IU]/mL (ref 0.450–4.500)

## 2019-07-08 ENCOUNTER — Telehealth: Payer: Self-pay

## 2019-07-08 NOTE — Telephone Encounter (Signed)
-----   Message from Trinna Post, [place] sent at 07/08/2019  9:19 AM EST ----- Can we call to schedule a two week follow up for BP please?     Hi Edgar Martinez,   Your labs look overall stable except for your kidney function. Your creatinine has gone up which can mean your kidney function is declining. Sometimes this can be due to dehydration, using too many anti-inflammatories like ibuprofen, and prescribed medications but it can also be due to damage to your kidneys from high blood pressure. I think we should bump your appointment up to two weeks instead of one month and recheck your labs and BP then. I am going to have my medical assistant reach out to schedule you.  Best, Fabio Bering

## 2019-07-08 NOTE — Telephone Encounter (Signed)
Pt advised Apt made for 07/29/2019 at 8am  Thanks,   -Mickel Baas

## 2019-07-08 NOTE — Telephone Encounter (Signed)
LMTCB 07/08/2019  Thanks,   -Mickel Baas

## 2019-07-11 ENCOUNTER — Telehealth: Payer: PRIVATE HEALTH INSURANCE

## 2019-07-29 ENCOUNTER — Other Ambulatory Visit: Payer: Self-pay

## 2019-07-29 ENCOUNTER — Ambulatory Visit: Payer: PRIVATE HEALTH INSURANCE | Admitting: Physician Assistant

## 2019-07-29 ENCOUNTER — Encounter: Payer: Self-pay | Admitting: Physician Assistant

## 2019-07-29 VITALS — BP 130/86 | HR 76 | Temp 97.7°F | Wt 292.0 lb

## 2019-07-29 DIAGNOSIS — I10 Essential (primary) hypertension: Secondary | ICD-10-CM | POA: Diagnosis not present

## 2019-07-29 MED ORDER — AMLODIPINE BESYLATE 2.5 MG PO TABS: 2.5000 mg | ORAL_TABLET | Freq: Every day | ORAL | 0 refills | Status: DC

## 2019-07-29 NOTE — Progress Notes (Signed)
Patient: Edgar Martinez Male    DOB: Jun 18, 1986   33 y.o.   MRN: [id number] Visit Date: 07/29/2019  Today's Provider: Trinna Post, PA-C   Chief Complaint  Patient presents with  . Hypertension   Subjective:     Hypertension This is a chronic problem. Pertinent negatives include no anxiety, blurred vision, chest pain, headaches, malaise/fatigue, neck pain, palpitations, peripheral edema, shortness of breath or sweats. There are no associated agents to hypertension. There are no compliance problems.    Last visit added spironolactone 25 mg daily to losartan-HCTZ 100-25 mg daily. He reports he is taking the medication but experiencing erectile dysfunction with this.   BP Readings from Last 3 Encounters:  07/29/19 130/86  07/04/19 (!) 144/95  02/24/19 (!) 147/87    Wt Readings from Last 3 Encounters:  07/29/19 292 lb (132.5 kg)  07/04/19 298 lb 3.2 oz (135.3 kg)  02/24/19 299 lb (135.6 kg)    CMP Latest Ref Rng & Units 07/04/2019 02/22/2018 11/30/2016  Glucose 65 - 99 mg/dL 98 90 97  BUN 6 - 20 mg/dL 12 13 11   Creatinine 0.76 - 1.27 mg/dL 1.48(H) 1.32(H) 1.38  Sodium 134 - 144 mmol/L 140 141 138  Potassium 3.5 - 5.2 mmol/L 4.2 4.1 4.2  Chloride 96 - 106 mmol/L 102 101 105  CO2 20 - 29 mmol/L 23 24 29   Calcium 8.7 - 10.2 mg/dL 9.5 9.9 9.3  Total Protein 6.0 - 8.5 g/dL 7.0 7.1 -  Total Bilirubin 0.0 - 1.2 mg/dL 0.5 0.4 -  Alkaline Phos 39 - 117 IU/L 53 50 -  AST 0 - 40 IU/L 21 17 -  ALT 0 - 44 IU/L 19 17 -    No Known Allergies   Current Outpatient Medications:  .  busPIRone (BUSPAR) 15 MG tablet, Take 1 tablet (15 mg total) by mouth 2 (two) times daily., Disp: 180 tablet, Rfl: 0 .  Dexlansoprazole (DEXILANT) 30 MG capsule, TAKE 1 CAPSULE(30 MG) BY MOUTH DAILY, Disp: 90 capsule, Rfl: 3 .  losartan-hydrochlorothiazide (HYZAAR) 100-25 MG tablet, TAKE 1 TABLET BY MOUTH DAILY, Disp: 90 tablet, Rfl: 1 .  ondansetron (ZOFRAN) 4 MG tablet, Take 1 tablet (4 mg total)  by mouth every 8 (eight) hours as needed for nausea or vomiting., Disp: 20 tablet, Rfl: 0 .  sildenafil (REVATIO) 20 MG tablet, 1-5 tablets (20 - 100 mg) 1 hour prior to intercourse., Disp: 30 tablet, Rfl: 0 .  spironolactone (ALDACTONE) 25 MG tablet, Take 1 tablet (25 mg total) by mouth daily., Disp: 90 tablet, Rfl: 0 .  traZODone (DESYREL) 100 MG tablet, Take 1 tablet (100 mg total) by mouth at bedtime., Disp: 90 tablet, Rfl: 1  Review of Systems  Constitutional: Negative.  Negative for malaise/fatigue.  Eyes: Negative for blurred vision.  Respiratory: Negative.  Negative for shortness of breath.   Cardiovascular: Negative.  Negative for chest pain and palpitations.  Gastrointestinal: Negative.   Musculoskeletal: Negative for neck pain.  Neurological: Negative for dizziness, light-headedness and headaches.    Social History   Tobacco Use  . Smoking status: Never Smoker  . Smokeless tobacco: Never Used  Substance Use Topics  . Alcohol use: Yes      Objective:   BP 130/86 (BP Location: Left Arm, Patient Position: Sitting, Cuff Size: Large)   Pulse 76   Temp 97.7 F (36.5 C) (Temporal)   Wt 292 lb (132.5 kg)   BMI 34.63 kg/m  Vitals:  07/29/19 0811  BP: 130/86  Pulse: 76  Temp: 97.7 F (36.5 C)  TempSrc: Temporal  Weight: 292 lb (132.5 kg)  Body mass index is 34.63 kg/m.   Physical Exam Constitutional:      Appearance: Normal appearance.  Cardiovascular:     Rate and Rhythm: Normal rate and regular rhythm.     Heart sounds: Normal heart sounds.  Pulmonary:     Effort: Pulmonary effort is normal.     Breath sounds: Normal breath sounds.  Skin:    General: Skin is warm and dry.  Neurological:     Mental Status: He is alert and oriented to person, place, and time. Mental status is at baseline.  Psychiatric:        Mood and Affect: Mood normal.        Behavior: Behavior normal.      No results found for any visits on 07/29/19.     Assessment & Plan     1. Essential (primary) hypertension  BP improved however patient having intolerable side effects. Scr was elevated on last CMET. I am unsure if this is an AKI or represents a larger decline in kidney function due to HTN. Will repeat today. Patient reports he is not taking NSAIDs. We will replace spironolactone 25 mg with amlodipine 2.5 mg QD. He had some pedal edema with amlodipine before but unknown what dose. Will start low. Follow up in three months. Will monitor kidney function closely.   - amLODipine (NORVASC) 2.5 MG tablet; Take 1 tablet (2.5 mg total) by mouth daily.  Dispense: 90 tablet; Refill: 0 - Comprehensive Metabolic Panel (CMET)  The entirety of the information documented in the History of Present Illness, Review of Systems and Physical Exam were personally obtained by me. Portions of this information were initially documented by Ashley Royalty, CMA and reviewed by me for thoroughness and accuracy.   F/u 3 months HTN     Trinna Post, PA-C  [facility]

## 2019-07-29 NOTE — Patient Instructions (Signed)

## 2019-07-30 LAB — COMPREHENSIVE METABOLIC PANEL
ALT: 18 IU/L (ref 0–44)
AST: 15 IU/L (ref 0–40)
Albumin/Globulin Ratio: 2 (ref 1.2–2.2)
Albumin: 4.5 g/dL (ref 4.0–5.0)
Alkaline Phosphatase: 53 IU/L (ref 39–117)
BUN/Creatinine Ratio: 9 (ref 9–20)
BUN: 13 mg/dL (ref 6–20)
Bilirubin Total: 0.4 mg/dL (ref 0.0–1.2)
CO2: 26 mmol/L (ref 20–29)
Calcium: 9.3 mg/dL (ref 8.7–10.2)
Chloride: 100 mmol/L (ref 96–106)
Creatinine, Ser: 1.39 mg/dL — ABNORMAL HIGH (ref 0.76–1.27)
GFR calc Af Amer: 76 mL/min/{1.73_m2} (ref >59)
GFR calc non Af Amer: 66 mL/min/{1.73_m2} (ref >59)
Globulin, Total: 2.2 g/dL (ref 1.5–4.5)
Glucose: 96 mg/dL (ref 65–99)
Potassium: 3.9 mmol/L (ref 3.5–5.2)
Sodium: 138 mmol/L (ref 134–144)
Total Protein: 6.7 g/dL (ref 6.0–8.5)

## 2019-08-12 ENCOUNTER — Ambulatory Visit: Payer: PRIVATE HEALTH INSURANCE | Admitting: Physician Assistant

## 2019-08-19 ENCOUNTER — Other Ambulatory Visit: Payer: Self-pay | Admitting: Physician Assistant

## 2019-08-19 DIAGNOSIS — R112 Nausea with vomiting, unspecified: Secondary | ICD-10-CM

## 2019-08-19 NOTE — Telephone Encounter (Signed)
Requested medication (s) are due for refill today: yes  Requested medication (s) are on the active medication list: yes  Last refill:  05/15/2019  Future visit scheduled: no  Notes to clinic:  not delegated   Requested Prescriptions  Pending Prescriptions Disp Refills   ondansetron (ZOFRAN) 4 MG tablet [Pharmacy Med Name: ONDANSETRON 4MG  TABLETS] 20 tablet 0    Sig: TAKE 1 TABLET(4 MG) BY MOUTH EVERY 8 HOURS AS NEEDED FOR NAUSEA OR VOMITING      Not Delegated - Gastroenterology: Antiemetics Failed - 08/19/2019  8:34 AM      Failed - This refill cannot be delegated      Passed - Valid encounter within last 6 months    Recent Outpatient Visits           3 weeks ago Essential (primary) hypertension   [employer], Wendee Beavers, PA-C   1 month ago Annual physical exam   [facility] [place], [place], [place]   5 months ago [place] Carles Collet M, [place]   11 months ago Essential hypertension   [place], [place], [place]   1 year ago Acute non-recurrent maxillary sinusitis   [facility] [place], [place], [place]

## 2019-08-19 NOTE — Telephone Encounter (Signed)
Patients last office visit was 07/29/2019. Please advise.

## 2019-08-22 ENCOUNTER — Encounter: Payer: Self-pay | Admitting: Emergency Medicine

## 2019-08-22 ENCOUNTER — Ambulatory Visit
Admission: EM | Admit: 2019-08-22 | Discharge: 2019-08-22 | Disposition: A | Discharge status: Home / Self Care | Payer: PRIVATE HEALTH INSURANCE | Attending: Family Medicine | Admitting: Family Medicine

## 2019-08-22 ENCOUNTER — Other Ambulatory Visit: Payer: Self-pay

## 2019-08-22 DIAGNOSIS — Z20822 Contact with and (suspected) exposure to covid-19: Secondary | ICD-10-CM | POA: Diagnosis not present

## 2019-08-22 DIAGNOSIS — U071 COVID-19: Secondary | ICD-10-CM | POA: Diagnosis not present

## 2019-08-22 NOTE — ED Provider Notes (Signed)
[facility] ____________________________________________  Time seen: Approximately 8:18 PM  I have reviewed the triage vital signs and the nursing notes.   HISTORY  Chief Complaint COVID Test   HPI STIHL BRUSSO is a 34 y.o. male presenting for COVID-19 testing.  Reports that his wife tested positive for COVID-19 this past Monday.  Patient reports that he is feeling well and denies symptoms.  Denies cough, congestion, sore throat, chest pain, shortness of breath, vomiting, diarrhea, change in taste or smell or fevers.  Denies aggravating alleviating factors.  Denies complaints.   Past Medical History:  Diagnosis Date  . Chicken pox   . GERD (gastroesophageal reflux disease)   . Hypertension     Patient Active Problem List   Diagnosis Date Noted  . Class 2 severe obesity with serious comorbidity and body mass index (BMI) of 36.0 to 36.9 in adult [facility]) 03/03/2019  . Anxiety 08/27/2018  . GERD (gastroesophageal reflux disease) 11/19/2016  . Changing nevus 11/19/2016  . Annual physical exam 05/16/2016  . Essential hypertension 05/16/2016  . Erectile dysfunction 05/16/2016  . Essential (primary) hypertension 12/14/2011  . GERD (gastroesophageal reflux disease) 12/14/2011    Past Surgical History:  Procedure Laterality Date  . HERNIA REPAIR  2012   Bilateral inguinal hernia with mesh.  Marland Kitchen VASECTOMY     2015  . WRIST SURGERY  2015     No current facility-administered medications for this encounter.  Current Outpatient Medications:  .  amLODipine (NORVASC) 2.5 MG tablet, Take 1 tablet (2.5 mg total) by mouth daily., Disp: 90 tablet, Rfl: 0 .  busPIRone (BUSPAR) 15 MG tablet, Take 1 tablet (15 mg total) by mouth 2 (two) times daily., Disp: 180 tablet, Rfl: 0 .  Dexlansoprazole (DEXILANT) 30 MG capsule, TAKE 1 CAPSULE(30 MG) BY MOUTH DAILY, Disp: 90 capsule, Rfl: 3 .  losartan-hydrochlorothiazide (HYZAAR) 100-25 MG tablet, TAKE 1 TABLET BY MOUTH DAILY, Disp:  90 tablet, Rfl: 1 .  traZODone (DESYREL) 100 MG tablet, Take 1 tablet (100 mg total) by mouth at bedtime., Disp: 90 tablet, Rfl: 1 .  ondansetron (ZOFRAN) 4 MG tablet, TAKE 1 TABLET(4 MG) BY MOUTH EVERY 8 HOURS AS NEEDED FOR NAUSEA OR VOMITING, Disp: 20 tablet, Rfl: 0 .  sildenafil (REVATIO) 20 MG tablet, 1-5 tablets (20 - 100 mg) 1 hour prior to intercourse., Disp: 30 tablet, Rfl: 0  Allergies Patient has no known allergies.  Family History  Problem Relation Age of Onset  . Hyperlipidemia Mother   . Hypertension Mother   . Hypothyroidism Mother   . Hyperlipidemia Father   . Hypertension Father   . Heart disease Father   . Diabetes Maternal Grandfather   . Heart attack Maternal Grandfather   . Lung cancer Paternal Grandfather   . CVA Maternal Grandmother   . Heart attack Maternal Grandmother   . Stomach cancer Paternal Grandmother     Social History Social History   Tobacco Use  . Smoking status: Never Smoker  . Smokeless tobacco: Never Used  Substance Use Topics  . Alcohol use: Yes  . Drug use: No    Review of Systems Constitutional: No fever ENT: No sore throat. Cardiovascular: Denies chest pain. Respiratory: Denies shortness of breath. Gastrointestinal: No abdominal pain.  No nausea, no vomiting.  No diarrhea.  Skin: Negative for rash.   ____________________________________________   PHYSICAL EXAM:  VITAL SIGNS: ED Triage Vitals  Enc Vitals Group     BP 08/22/19 1826 (!) 141/96  Pulse Rate 08/22/19 1826 74     Resp 08/22/19 1826 16     Temp 08/22/19 1826 98.5 F (36.9 C)     Temp Source 08/22/19 1826 Oral     SpO2 08/22/19 1826 97 %     Weight 08/22/19 1825 290 lb (131.5 kg)     Height 08/22/19 1825 6\' 5"  (1.956 m)     Head Circumference --      Peak Flow --      Pain Score 08/22/19 1825 0     Pain Loc --      Pain Edu? --      Excl. in [place]? --     Constitutional: Alert and oriented. Well appearing and in no acute distress. Eyes: Conjunctivae  are normal. ENT      Head: Normocephalic and atraumatic. Cardiovascular: Normal rate, regular rhythm. Grossly normal heart sounds.  Good peripheral circulation. Respiratory: Normal respiratory effort without tachypnea nor retractions. Breath sounds are clear and equal bilaterally. No wheezes, rales, rhonchi. Musculoskeletal:  Steady gait.  Neurologic:  Normal speech and language. Speech is normal. No gait instability.  Skin:  Skin is warm, dry and intact. No rash noted. Psychiatric: Mood and affect are normal. Speech and behavior are normal. Patient exhibits appropriate insight and judgment   ___________________________________________   LABS (all labs ordered are listed, but only abnormal results are displayed)  Labs Reviewed  NOVEL CORONAVIRUS, NAA (HOSP ORDER, SEND-OUT TO REF LAB; TAT 18-24 HRS)     PROCEDURES Procedures   INITIAL IMPRESSION / ASSESSMENT AND PLAN / ED COURSE  Pertinent labs & imaging results that were available during my care of the patient were reviewed by me and considered in my medical decision making (see chart for details).  Well-appearing patient.  No acute distress.  Asymptomatic.  COVID-19 exposure.  COVID-19 testing completed advice given.  Monitor.  Discussed follow up and return parameters including no resolution or any worsening concerns. Patient verbalized understanding and agreed to plan.   ____________________________________________   FINAL CLINICAL IMPRESSION(S) / ED DIAGNOSES  Final diagnoses:  Encounter for screening laboratory testing for COVID-19 virus     ED Discharge Orders    None       Note: This dictation was prepared with Dragon dictation along with smaller phrase technology. Any transcriptional errors that result from this process are unintentional.         Marylene Land, NP 08/22/19 2019

## 2019-08-22 NOTE — ED Triage Notes (Signed)
Patient here for COVID test.  Patient states that his wife tested positive for COVID on Monday.  Patient denies any symptoms.

## 2019-08-25 ENCOUNTER — Telehealth (HOSPITAL_COMMUNITY): Payer: Self-pay | Admitting: Emergency Medicine

## 2019-08-25 NOTE — Telephone Encounter (Signed)

## 2019-08-27 LAB — NOVEL CORONAVIRUS, NAA (HOSP ORDER, SEND-OUT TO REF LAB; TAT 18-24 HRS): SARS-CoV-2, NAA: DETECTED — AB

## 2019-09-09 ENCOUNTER — Other Ambulatory Visit: Payer: Self-pay | Admitting: Physician Assistant

## 2019-09-09 DIAGNOSIS — N529 Male erectile dysfunction, unspecified: Secondary | ICD-10-CM

## 2019-09-10 NOTE — Telephone Encounter (Signed)
Requested Prescriptions  Pending Prescriptions Disp Refills  . sildenafil (REVATIO) 20 MG tablet [Pharmacy Med Name: SILDENAFIL 20 MG TABLET] 30 tablet 0    Sig: TAKE 1-5 TABLETS BY MOUTH 1 HOUR PRIOR TO INTERCOURSE     Urology: Erectile Dysfunction Agents Failed - 09/09/2019  9:30 PM      Failed - Last BP in normal range    BP Readings from Last 1 Encounters:  08/22/19 (!) 141/96         Passed - Valid encounter within last 12 months    Recent Outpatient Visits          1 month ago Essential (primary) hypertension   [employer], Wendee Beavers, PA-C   2 months ago Annual physical exam   [facility] Col[facility], [place]   6 months ago [place] [facility], Wendee Beavers, [place]   1 year ago Essential hypertension   [place], Wendee Beavers, [place]   1 year ago Acute non-recurrent maxillary sinusitis   [facility] [place], Fab[place] Bering [place], [place]

## 2019-09-17 ENCOUNTER — Other Ambulatory Visit: Payer: Self-pay | Admitting: Physician Assistant

## 2019-09-17 DIAGNOSIS — I10 Essential (primary) hypertension: Secondary | ICD-10-CM

## 2019-10-01 ENCOUNTER — Other Ambulatory Visit: Payer: Self-pay | Admitting: Physician Assistant

## 2019-10-01 DIAGNOSIS — G4700 Insomnia, unspecified: Secondary | ICD-10-CM

## 2019-11-07 ENCOUNTER — Other Ambulatory Visit: Payer: Self-pay | Admitting: Physician Assistant

## 2019-11-07 DIAGNOSIS — I10 Essential (primary) hypertension: Secondary | ICD-10-CM

## 2019-11-07 DIAGNOSIS — F419 Anxiety disorder, unspecified: Secondary | ICD-10-CM

## 2019-11-14 ENCOUNTER — Encounter: Payer: Self-pay | Admitting: Physician Assistant

## 2020-03-02 ENCOUNTER — Other Ambulatory Visit: Payer: Self-pay

## 2020-03-02 ENCOUNTER — Ambulatory Visit: Payer: PRIVATE HEALTH INSURANCE | Admitting: Physician Assistant

## 2020-03-02 ENCOUNTER — Encounter: Payer: Self-pay | Admitting: Physician Assistant

## 2020-03-02 VITALS — BP 136/84 | HR 74 | Temp 98.4°F | Wt 302.2 lb

## 2020-03-02 DIAGNOSIS — K219 Gastro-esophageal reflux disease without esophagitis: Secondary | ICD-10-CM

## 2020-03-02 DIAGNOSIS — Z6836 Body mass index (BMI) 36.0-36.9, adult: Secondary | ICD-10-CM

## 2020-03-02 DIAGNOSIS — R112 Nausea with vomiting, unspecified: Secondary | ICD-10-CM | POA: Diagnosis not present

## 2020-03-02 DIAGNOSIS — F419 Anxiety disorder, unspecified: Secondary | ICD-10-CM | POA: Diagnosis not present

## 2020-03-02 DIAGNOSIS — G4700 Insomnia, unspecified: Secondary | ICD-10-CM

## 2020-03-02 DIAGNOSIS — I10 Essential (primary) hypertension: Secondary | ICD-10-CM

## 2020-03-02 MED ORDER — AMLODIPINE BESYLATE 2.5 MG PO TABS: ORAL_TABLET | ORAL | 1 refills | Status: DC

## 2020-03-02 MED ORDER — ONDANSETRON HCL 4 MG PO TABS: ORAL_TABLET | ORAL | 0 refills | Status: DC

## 2020-03-02 MED ORDER — DEXILANT 30 MG PO CPDR: DELAYED_RELEASE_CAPSULE | ORAL | 3 refills | Status: DC

## 2020-03-02 MED ORDER — TRAZODONE HCL 100 MG PO TABS: ORAL_TABLET | ORAL | 1 refills | Status: DC

## 2020-03-02 MED ORDER — BUSPIRONE HCL 15 MG PO TABS: 15.0000 mg | ORAL_TABLET | Freq: Two times a day (BID) | ORAL | 1 refills | Status: DC

## 2020-03-02 MED ORDER — LOSARTAN POTASSIUM-HCTZ 100-25 MG PO TABS: 1.0000 | ORAL_TABLET | Freq: Every day | ORAL | 1 refills | Status: DC

## 2020-03-02 NOTE — Patient Instructions (Signed)

## 2020-03-02 NOTE — Progress Notes (Signed)
Established patient visit   Patient: Edgar Martinez   DOB: 04-03-1986   34 y.o. Male  MRN: [id number] Visit Date: 03/02/2020  Today's healthcare provider: Trinna Post, PA-C   Chief Complaint  Patient presents with  . Hypertension  . Anxiety  I,Porsha C McClurkin,acting as a scribe for Trinna Post, PA-C.,have documented all relevant documentation on the behalf of Trinna Post, PA-C,as directed by  Trinna Post, PA-C while in the presence of Trinna Post, PA-C.  Subjective    HPI  Hypertension, follow-up  BP Readings from Last 3 Encounters:  03/02/20 136/84  08/22/19 (!) 141/96  07/29/19 130/86   Wt Readings from Last 3 Encounters:  03/02/20 (!) 302 lb 3.2 oz (137.1 kg)  08/22/19 290 lb (131.5 kg)  07/29/19 292 lb (132.5 kg)     He was last seen for hypertension 6 months ago.  BP at that visit was 130/86. Management since that visit includes Replace spironolactone 25 mg with amlodipine 2.5 mg QD.  He reports good compliance with treatment. He is not having side effects.  He is following a Regular diet. He is exercising. He does not smoke.  Use of agents associated with hypertension: none.   Outside blood pressures are not being checked. Symptoms: No chest pain No chest pressure  No palpitations No syncope  No dyspnea No orthopnea  No paroxysmal nocturnal dyspnea No lower extremity edema   Pertinent labs: Lab Results  Component Value Date   CHOL 198 07/04/2019   HDL 40 07/04/2019   LDLCALC 143 (H) 07/04/2019   TRIG 83 07/04/2019   CHOLHDL 5.0 07/04/2019   Lab Results  Component Value Date   NA 138 07/29/2019   K 3.9 07/29/2019   CREATININE 1.39 (H) 07/29/2019   GFRNONAA 66 07/29/2019   GFRAA 76 07/29/2019   GLUCOSE 96 07/29/2019     The ASCVD Risk score (Goff DC Jr., et al., 2013) failed to calculate for the following reasons:   The 2013 ASCVD risk score is only valid for ages 25 to 42    I discussed the limitations of  evaluation and management by telemedicine and the availability of in person appointments. The patient expressed understanding and agreed to proceed.  Anxiety, Follow-up  He was last seen for anxiety 8 months ago. Changes made at last visit include increased Buspar to 15 MG twice daily.Marland Kitchen   He reports good compliance with treatment. He reports good tolerance of treatment. He is not having side effects.   He feels his anxiety is mild and Improved since last visit.  Symptoms: No chest pain No difficulty concentrating  No dizziness No fatigue  No feelings of losing control No insomnia  No irritable No palpitations  No panic attacks No racing thoughts  No shortness of breath No sweating  No tremors/shakes    GAD-7 Results GAD-7 Generalized Anxiety Disorder Screening Tool 02/24/2019  1. Feeling Nervous, Anxious, or on Edge 2  2. Not Being Able to Stop or Control Worrying 2  3. Worrying Too Much About Different Things 1  4. Trouble Relaxing 1  5. Being So Restless it's Hard To Sit Still 1  6. Becoming Easily Annoyed or Irritable 2  7. Feeling Afraid As If Something Awful Might Happen 2  Total GAD-7 Score 11  Difficulty At Work, Home, or Getting  Along With Others? Somewhat difficult    PHQ-9 Scores PHQ9 SCORE ONLY 07/04/2019 02/24/2019 08/24/2017  PHQ-9 Total Score 2  8 3   Doing well with trazodone PRN QHS.  ---------------------------------------------------------------------------------------------------      Medications: Outpatient Medications Prior to Visit  Medication Sig  . amLODipine (NORVASC) 2.5 MG tablet TAKE 1 TABLET(2.5 MG) BY MOUTH DAILY  . Dexlansoprazole (DEXILANT) 30 MG capsule TAKE 1 CAPSULE(30 MG) BY MOUTH DAILY  . losartan-hydrochlorothiazide (HYZAAR) 100-25 MG tablet TAKE 1 TABLET BY MOUTH DAILY  . ondansetron (ZOFRAN) 4 MG tablet TAKE 1 TABLET(4 MG) BY MOUTH EVERY 8 HOURS AS NEEDED FOR NAUSEA OR VOMITING  . sildenafil (REVATIO) 20 MG tablet TAKE 1-5  TABLETS BY MOUTH 1 HOUR PRIOR TO INTERCOURSE  . traZODone (DESYREL) 100 MG tablet TAKE 1 TABLET(100 MG) BY MOUTH AT BEDTIME   No facility-administered medications prior to visit.    Review of Systems  Constitutional: Negative.   Respiratory: Negative.   Cardiovascular: Negative.   Musculoskeletal: Negative.   Neurological: Negative.   Psychiatric/Behavioral: Negative.       Objective    BP 136/84 (BP Location: Left Arm, Patient Position: Sitting, Cuff Size: Normal)   Pulse 74   Temp 98.4 F (36.9 C) (Oral)   Wt (!) 302 lb 3.2 oz (137.1 kg)   SpO2 97%   BMI 35.84 kg/m    Physical Exam Constitutional:      Appearance: Normal appearance. He is obese.  Cardiovascular:     Rate and Rhythm: Normal rate and regular rhythm.     Pulses: Normal pulses.     Heart sounds: Normal heart sounds.  Pulmonary:     Effort: Pulmonary effort is normal.     Breath sounds: Normal breath sounds.  Skin:    General: Skin is warm and dry.  Neurological:     General: No focal deficit present.     Mental Status: He is alert and oriented to person, place, and time.  Psychiatric:        Mood and Affect: Mood normal.        Behavior: Behavior normal.       No results found for any visits on 03/02/20.  Assessment & Plan    1. Essential hypertension Well controlled Continue current medications  - amLODipine (NORVASC) 2.5 MG tablet; TAKE 1 TABLET(2.5 MG) BY MOUTH DAILY  Dispense: 90 tablet; Refill: 1 - losartan-hydrochlorothiazide (HYZAAR) 100-25 MG tablet; Take 1 tablet by mouth daily.  Dispense: 90 tablet; Refill: 1  2. Anxiety  - busPIRone (BUSPAR) 15 MG tablet; Take 1 tablet (15 mg total) by mouth 2 (two) times daily.  Dispense: 90 tablet; Refill: 1  3. Insomnia, unspecified type  - traZODone (DESYREL) 100 MG tablet; TAKE 1 TABLET(100 MG) BY MOUTH AT BEDTIME  Dispense: 90 tablet; Refill: 1  4. Nausea and vomiting, intractability of vomiting not specified, unspecified vomiting  type  - ondansetron (ZOFRAN) 4 MG tablet; TAKE 1 TABLET(4 MG) BY MOUTH EVERY 8 HOURS AS NEEDED FOR NAUSEA OR VOMITING  Dispense: 20 tablet; Refill: 0  5. Gastroesophageal reflux disease without esophagitis  - Dexlansoprazole (DEXILANT) 30 MG capsule; TAKE 1 CAPSULE(30 MG) BY MOUTH DAILY  Dispense: 90 capsule; Refill: 3  6. Class 2 Severe Obesity   Discussed importance of healthy weight management Discussed diet and exercise  Return in about 4 months (around 07/03/2020) for CPE.      ITrinna Post, PA-C, have reviewed all documentation for this visit. The documentation on 03/03/20 for the exam, diagnosis, procedures, and orders are all accurate and complete.    Trinna Post, PA-C  [place] [phone] (phone) [phone] (fax)  [place]

## 2020-03-08 ENCOUNTER — Ambulatory Visit
Admission: EM | Admit: 2020-03-08 | Discharge: 2020-03-08 | Disposition: A | Discharge status: Home / Self Care | Payer: PRIVATE HEALTH INSURANCE | Attending: Emergency Medicine | Admitting: Emergency Medicine

## 2020-03-08 DIAGNOSIS — K1379 Other lesions of oral mucosa: Secondary | ICD-10-CM

## 2020-03-08 LAB — POCT RAPID STREP A (OFFICE): Rapid Strep A Screen: NEGATIVE

## 2020-03-08 MED ORDER — PREDNISONE 10 MG (21) PO TBPK: ORAL_TABLET | Freq: Every day | ORAL | 0 refills | Status: DC

## 2020-03-08 MED ORDER — METHYLPREDNISOLONE SODIUM SUCC 125 MG IJ SOLR
125.0000 mg | Freq: Once | INTRAMUSCULAR | Status: AC
  Administered 2020-03-08: 125 mg via INTRAMUSCULAR

## 2020-03-08 NOTE — ED Provider Notes (Signed)
Edgar Martinez    CSN: [id number] Arrival date & time: 03/08/20  1121      History   Chief Complaint Chief Complaint  Patient presents with  . Sore Throat    HPI Edgar Martinez is a 34 y.o. male.   Patient presents with 1 day history of swollen uvula.  He states his throat is uncomfortable and he feels like he is "choking" since yesterday morning.  He denies respiratory distress; he has been able to eat and drink.  He denies fever, chills, earache, congestion, cough, shortness of breath, vomiting, diarrhea, rash, or other symptoms.  He denies history of similar symptoms.  The history is provided by the patient.    Past Medical History:  Diagnosis Date  . Chicken pox   . GERD (gastroesophageal reflux disease)   . Hypertension     Patient Active Problem List   Diagnosis Date Noted  . Class 2 severe obesity with serious comorbidity and body mass index (BMI) of 36.0 to 36.9 in adult [facility]) 03/03/2019  . Anxiety 08/27/2018  . GERD (gastroesophageal reflux disease) 11/19/2016  . Changing nevus 11/19/2016  . Annual physical exam 05/16/2016  . Essential hypertension 05/16/2016  . Erectile dysfunction 05/16/2016  . Essential (primary) hypertension 12/14/2011  . GERD (gastroesophageal reflux disease) 12/14/2011    Past Surgical History:  Procedure Laterality Date  . HERNIA REPAIR  2012   Bilateral inguinal hernia with mesh.  Marland Kitchen VASECTOMY     2015  . WRIST SURGERY  2015       Home Medications    Prior to Admission medications   Medication Sig Start Date End Date Taking? Authorizing Provider  amLODipine (NORVASC) 2.5 MG tablet TAKE 1 TABLET(2.5 MG) BY MOUTH DAILY 03/02/20  Yes Pollak, Adriana M, PA-C  busPIRone (BUSPAR) 15 MG tablet Take 1 tablet (15 mg total) by mouth 2 (two) times daily. 03/02/20  Yes Trinna Post, PA-C  Dexlansoprazole (DEXILANT) 30 MG capsule TAKE 1 CAPSULE(30 MG) BY MOUTH DAILY 03/02/20  Yes Carles Collet M, PA-C    losartan-hydrochlorothiazide (HYZAAR) 100-25 MG tablet Take 1 tablet by mouth daily. 03/02/20  Yes Trinna Post, PA-C  traZODone (DESYREL) 100 MG tablet TAKE 1 TABLET(100 MG) BY MOUTH AT BEDTIME 03/02/20  Yes Pollak, Adriana M, PA-C  ondansetron (ZOFRAN) 4 MG tablet TAKE 1 TABLET(4 MG) BY MOUTH EVERY 8 HOURS AS NEEDED FOR NAUSEA OR VOMITING 03/02/20   Trinna Post, PA-C  predniSONE (STERAPRED UNI-PAK 21 TAB) 10 MG (21) TBPK tablet Take by mouth daily. Take 6 tabs by mouth daily  for 1 day, then 5 tabs for 1 day, then 4 tabs for 1 day, then 3 tabs for 1 day, 2 tabs for 1 day, then 1 tab by mouth daily for 1 day 03/08/20   Sharion Balloon, NP  sildenafil (REVATIO) 20 MG tablet TAKE 1-5 TABLETS BY MOUTH 1 HOUR PRIOR TO INTERCOURSE 09/10/19   Trinna Post, PA-C    Family History Family History  Problem Relation Age of Onset  . Hyperlipidemia Mother   . Hypertension Mother   . Hypothyroidism Mother   . Hyperlipidemia Father   . Hypertension Father   . Heart disease Father   . Diabetes Maternal Grandfather   . Heart attack Maternal Grandfather   . Lung cancer Paternal Grandfather   . CVA Maternal Grandmother   . Heart attack Maternal Grandmother   . Stomach cancer Paternal Grandmother     Social History Social History  Tobacco Use  . Smoking status: Former Research scientist (life sciences)  . Smokeless tobacco: Never Used  Vaping Use  . Vaping Use: Never used  Substance Use Topics  . Alcohol use: Yes  . Drug use: No     Allergies   Patient has no known allergies.   Review of Systems Review of Systems  Constitutional: Negative for chills and fever.  HENT: Positive for sore throat. Negative for ear pain and trouble swallowing.   Eyes: Negative for pain and visual disturbance.  Respiratory: Negative for cough, shortness of breath, wheezing and stridor.   Cardiovascular: Negative for chest pain and palpitations.  Gastrointestinal: Negative for abdominal pain and vomiting.  Genitourinary:  Negative for dysuria and hematuria.  Musculoskeletal: Negative for arthralgias and back pain.  Skin: Negative for color change and rash.  Neurological: Negative for seizures and syncope.  All other systems reviewed and are negative.    Physical Exam Triage Vital Signs ED Triage Vitals  Enc Vitals Group     BP 03/08/20 1129 (!) 145/97     Pulse Rate 03/08/20 1129 76     Resp 03/08/20 1129 16     Temp 03/08/20 1129 98.1 F (36.7 C)     Temp src --      SpO2 03/08/20 1129 95 %     Weight --      Height --      Head Circumference --      Peak Flow --      Pain Score 03/08/20 1126 2     Pain Loc --      Pain Edu? --      Excl. in [place]? --    No data found.  Updated Vital Signs BP (!) 145/97   Pulse 76   Temp 98.1 F (36.7 C)   Resp 16   SpO2 95%   Visual Acuity Right Eye Distance:   Left Eye Distance:   Bilateral Distance:    Right Eye Near:   Left Eye Near:    Bilateral Near:     Physical Exam Vitals and nursing note reviewed.  Constitutional:      General: He is not in acute distress.    Appearance: He is well-developed. He is not ill-appearing.  HENT:     Head: Normocephalic and atraumatic.     Right Ear: Tympanic membrane normal.     Left Ear: Tympanic membrane normal.     Nose: Nose normal.     Mouth/Throat:     Mouth: Mucous membranes are moist.     Pharynx: Oropharynx is clear. Uvula midline.     Tonsils: 0 on the right. 0 on the left.     Comments: Uvula is midline with mild swelling. Speech clear.  No difficulty swallowing.  Eyes:     Conjunctiva/sclera: Conjunctivae normal.  Cardiovascular:     Rate and Rhythm: Normal rate and regular rhythm.     Heart sounds: No murmur heard.   Pulmonary:     Effort: Pulmonary effort is normal. No respiratory distress.     Breath sounds: Normal breath sounds. No stridor. No wheezing or rhonchi.  Abdominal:     Palpations: Abdomen is soft.     Tenderness: There is no abdominal tenderness. There is no  guarding or rebound.  Musculoskeletal:     Cervical back: Neck supple.  Skin:    General: Skin is warm and dry.     Findings: No rash.  Neurological:     General: No focal  deficit present.     Mental Status: He is alert and oriented to person, place, and time.     Gait: Gait normal.  Psychiatric:        Mood and Affect: Mood normal.        Behavior: Behavior normal.      UC Treatments / Results  Labs (all labs ordered are listed, but only abnormal results are displayed) Labs Reviewed  CULTURE, GROUP A STREP [facility])  POCT RAPID STREP A (OFFICE)    EKG   Radiology No results found.  Procedures Procedures (including critical care time)  Medications Ordered in UC Medications  methylPREDNISolone sodium succinate (SOLU-MEDROL) 125 mg/2 mL injection 125 mg (has no administration in time range)    Initial Impression / Assessment and Plan / UC Course  I have reviewed the triage vital signs and the nursing notes.  Pertinent labs & imaging results that were available during my care of the patient were reviewed by me and considered in my medical decision making (see chart for details).   Mild uvular swelling.  Patient is in no acute distress.  Solu-Medrol given here.  Start prednisone taper tomorrow and follow up with his PCP tomorrow.  Strict instructions to call 911 or go to the ED if he has difficulty swallowing or breathing.  Patient agrees to plan of care.   Final Clinical Impressions(s) / UC Diagnoses   Final diagnoses:  Uvular swelling     Discharge Instructions     Call 911 or go to the Emergency Department if you have difficulty swallowing or breathing.    Your rapid strep test is negative.  A throat culture is pending; we will call you if it is positive.    Start taking the prednisone tomorrow as directed.    Follow up with your primary care provider tomorrow for a recheck.         ED Prescriptions    Medication Sig Dispense Auth. Provider    predniSONE (STERAPRED UNI-PAK 21 TAB) 10 MG (21) TBPK tablet Take by mouth daily. Take 6 tabs by mouth daily  for 1 day, then 5 tabs for 1 day, then 4 tabs for 1 day, then 3 tabs for 1 day, 2 tabs for 1 day, then 1 tab by mouth daily for 1 day 21 tablet Sharion Balloon, NP     PDMP not reviewed this encounter.   Sharion Balloon, NP 03/08/20 1155

## 2020-03-08 NOTE — Discharge Instructions (Addendum)
Call 911 or go to the Emergency Department if you have difficulty swallowing or breathing.    Your rapid strep test is negative.  A throat culture is pending; we will call you if it is positive.    Start taking the prednisone tomorrow as directed.    Follow up with your primary care provider tomorrow for a recheck.

## 2020-03-08 NOTE — ED Triage Notes (Signed)
Patient reports he woke up yesterday and his uvula was swollen. States it is uncomfortable and feels like he is constantly being choked.   Denies: cough, fever, ear pain, headaches, chest pain, ShOB  OTC: ibuprofen, cough lozenges

## 2020-03-10 LAB — CULTURE, GROUP A STREP (THRC)

## 2020-07-13 ENCOUNTER — Encounter: Payer: PRIVATE HEALTH INSURANCE | Admitting: Physician Assistant

## 2020-07-21 ENCOUNTER — Encounter: Payer: Self-pay | Admitting: Physician Assistant

## 2020-07-21 ENCOUNTER — Other Ambulatory Visit: Payer: Self-pay

## 2020-07-21 ENCOUNTER — Ambulatory Visit (INDEPENDENT_AMBULATORY_CARE_PROVIDER_SITE_OTHER): Payer: PRIVATE HEALTH INSURANCE | Admitting: Physician Assistant

## 2020-07-21 VITALS — BP 135/90 | HR 71 | Temp 98.2°F | Resp 16 | Ht 76.0 in | Wt 305.0 lb

## 2020-07-21 DIAGNOSIS — I10 Essential (primary) hypertension: Secondary | ICD-10-CM

## 2020-07-21 DIAGNOSIS — M79671 Pain in right foot: Secondary | ICD-10-CM | POA: Diagnosis not present

## 2020-07-21 DIAGNOSIS — Z0000 Encounter for general adult medical examination without abnormal findings: Secondary | ICD-10-CM

## 2020-07-21 DIAGNOSIS — Z1159 Encounter for screening for other viral diseases: Secondary | ICD-10-CM | POA: Diagnosis not present

## 2020-07-21 DIAGNOSIS — K219 Gastro-esophageal reflux disease without esophagitis: Secondary | ICD-10-CM

## 2020-07-21 DIAGNOSIS — F419 Anxiety disorder, unspecified: Secondary | ICD-10-CM

## 2020-07-21 DIAGNOSIS — M722 Plantar fascial fibromatosis: Secondary | ICD-10-CM

## 2020-07-21 NOTE — Progress Notes (Signed)
Complete physical exam   Patient: Edgar Martinez   DOB: 1986/03/11   34 y.o. Male  MRN: [id number] Visit Date: 07/21/2020  Today's healthcare provider: Trinna Post, PA-C   Chief Complaint  Patient presents with  . Annual Exam  . Hypertension  . Anxiety  . Foot Pain   Subjective    Edgar Martinez is a 34 y.o. male who presents today for a complete physical exam.   He does have additional problems to discuss today.   Hypertension, follow-up  BP Readings from Last 3 Encounters:  07/21/20 135/90  03/08/20 (!) 145/97  03/02/20 136/84   Wt Readings from Last 3 Encounters:  07/21/20 (!) 305 lb (138.3 kg)  03/02/20 (!) 302 lb 3.2 oz (137.1 kg)  08/22/19 290 lb (131.5 kg)     He was last seen for hypertension 6 months ago.  BP at that visit was 145/97. Management since that visit includes no changes.  He reports good compliance with treatment. He is not having side effects.  He is following a Regular diet. He is not exercising. He does not smoke.  Use of agents associated with hypertension: none.   Outside blood pressures are not being checked. Symptoms: No chest pain No chest pressure  No palpitations No syncope  No dyspnea No orthopnea  No paroxysmal nocturnal dyspnea No lower extremity edema   Pertinent labs: Lab Results  Component Value Date   CHOL 198 07/04/2019   HDL 40 07/04/2019   LDLCALC 143 (H) 07/04/2019   TRIG 83 07/04/2019   CHOLHDL 5.0 07/04/2019   Lab Results  Component Value Date   NA 138 07/29/2019   K 3.9 07/29/2019   CREATININE 1.39 (H) 07/29/2019   GFRNONAA 66 07/29/2019   GFRAA 76 07/29/2019   GLUCOSE 96 07/29/2019     The ASCVD Risk score (Goff DC Jr., et al., 2013) failed to calculate for the following reasons:   The 2013 ASCVD risk score is only valid for ages 38 to 62   Anxiety, Follow-up  He was last seen for anxiety 6 months ago. Changes made at last visit include no changes.   He reports good compliance with  treatment. He reports fair tolerance of treatment. He is not having side effects.   He feels his anxiety is mild and Unchanged since last visit.  Symptoms: No chest pain No difficulty concentrating  No dizziness No fatigue  No feelings of losing control No insomnia  No irritable No palpitations  No panic attacks No racing thoughts  No shortness of breath No sweating  No tremors/shakes    GAD-7 Results GAD-7 Generalized Anxiety Disorder Screening Tool 02/24/2019  1. Feeling Nervous, Anxious, or on Edge 2  2. Not Being Able to Stop or Control Worrying 2  3. Worrying Too Much About Different Things 1  4. Trouble Relaxing 1  5. Being So Restless it's Hard To Sit Still 1  6. Becoming Easily Annoyed or Irritable 2  7. Feeling Afraid As If Something Awful Might Happen 2  Total GAD-7 Score 11  Difficulty At Work, Home, or Getting  Along With Others? Somewhat difficult   Patient also mentions that he has had pain in his right foot. He reports that he has history of plantar fascitis. He is having pain in the morning that is severe. Before he has tried stretches.   PHQ-9 Scores PHQ9 SCORE ONLY 07/21/2020 07/04/2019 02/24/2019  PHQ-9 Total Score 6 2 8  Past Medical History:  Diagnosis Date  . Chicken pox   . GERD (gastroesophageal reflux disease)   . Hypertension    Past Surgical History:  Procedure Laterality Date  . HERNIA REPAIR  2012   Bilateral inguinal hernia with mesh.  Marland Kitchen VASECTOMY     2015  . WRIST SURGERY  2015   Social History   Socioeconomic History  . Marital status: Married    Spouse name: Not on file  . Number of children: Not on file  . Years of education: Not on file  . Highest education level: Not on file  Occupational History  . Not on file  Tobacco Use  . Smoking status: Former Research scientist (life sciences)  . Smokeless tobacco: Never Used  Vaping Use  . Vaping Use: Never used  Substance and Sexual Activity  . Alcohol use: Yes  . Drug use: No  . Sexual activity: Yes     Partners: Female  Other Topics Concern  . Not on file  Social History Narrative  . Not on file   Social Determinants of Health   Financial Resource Strain:   . Difficulty of Paying Living Expenses: Not on file  Food Insecurity:   . Worried About Charity fundraiser in the Last Year: Not on file  . Ran Out of Food in the Last Year: Not on file  Transportation Needs:   . Lack of Transportation (Medical): Not on file  . Lack of Transportation (Non-Medical): Not on file  Physical Activity:   . Days of Exercise per Week: Not on file  . Minutes of Exercise per Session: Not on file  Stress:   . Feeling of Stress : Not on file  Social Connections:   . Frequency of Communication with Friends and Family: Not on file  . Frequency of Social Gatherings with Friends and Family: Not on file  . Attends Religious Services: Not on file  . Active Member of Clubs or Organizations: Not on file  . Attends Archivist Meetings: Not on file  . Marital Status: Not on file  Intimate Partner Violence:   . Fear of Current or Ex-Partner: Not on file  . Emotionally Abused: Not on file  . Physically Abused: Not on file  . Sexually Abused: Not on file   Family Status  Relation Name Status  . Mother  (Not Specified)  . Father  (Not Specified)  . MGF  Deceased  . PGF  Deceased  . MGM  Deceased  . PGM  Deceased   Family History  Problem Relation Age of Onset  . Hyperlipidemia Mother   . Hypertension Mother   . Hypothyroidism Mother   . Hyperlipidemia Father   . Hypertension Father   . Heart disease Father   . Diabetes Maternal Grandfather   . Heart attack Maternal Grandfather   . Lung cancer Paternal Grandfather   . CVA Maternal Grandmother   . Heart attack Maternal Grandmother   . Stomach cancer Paternal Grandmother    No Known Allergies  Patient Care Team: Paulene Floor as PCP - General (Physician Assistant)   Medications: Outpatient Medications Prior to Visit   Medication Sig  . amLODipine (NORVASC) 2.5 MG tablet TAKE 1 TABLET(2.5 MG) BY MOUTH DAILY  . busPIRone (BUSPAR) 15 MG tablet Take 1 tablet (15 mg total) by mouth 2 (two) times daily.  Marland Kitchen Dexlansoprazole (DEXILANT) 30 MG capsule TAKE 1 CAPSULE(30 MG) BY MOUTH DAILY  . losartan-hydrochlorothiazide (HYZAAR) 100-25 MG tablet Take 1  tablet by mouth daily.  . ondansetron (ZOFRAN) 4 MG tablet TAKE 1 TABLET(4 MG) BY MOUTH EVERY 8 HOURS AS NEEDED FOR NAUSEA OR VOMITING  . traZODone (DESYREL) 100 MG tablet TAKE 1 TABLET(100 MG) BY MOUTH AT BEDTIME  . predniSONE (STERAPRED UNI-PAK 21 TAB) 10 MG (21) TBPK tablet Take by mouth daily. Take 6 tabs by mouth daily  for 1 day, then 5 tabs for 1 day, then 4 tabs for 1 day, then 3 tabs for 1 day, 2 tabs for 1 day, then 1 tab by mouth daily for 1 day  . sildenafil (REVATIO) 20 MG tablet TAKE 1-5 TABLETS BY MOUTH 1 HOUR PRIOR TO INTERCOURSE (Patient not taking: Reported on 07/21/2020)   No facility-administered medications prior to visit.    Review of Systems  Constitutional: Negative.   HENT: Negative.   Eyes: Negative.   Respiratory: Negative.   Cardiovascular: Negative.   Gastrointestinal: Negative.   Endocrine: Negative.   Genitourinary: Negative.   Musculoskeletal: Negative.   Skin: Negative.   Allergic/Immunologic: Negative.   Neurological: Negative.   Hematological: Negative.   Psychiatric/Behavioral: Negative.       Objective    BP 135/90   Pulse 71   Temp 98.2 F (36.8 C)   Resp 16   Ht 6\' 4"  (1.93 m)   Wt (!) 305 lb (138.3 kg)   BMI 37.13 kg/m    Physical Exam Constitutional:      Appearance: Normal appearance.  Cardiovascular:     Rate and Rhythm: Normal rate and regular rhythm.     Heart sounds: Normal heart sounds.  Pulmonary:     Effort: Pulmonary effort is normal.     Breath sounds: Normal breath sounds.  Abdominal:     General: Bowel sounds are normal.     Palpations: Abdomen is soft.  Skin:    General: Skin is warm  and dry.  Neurological:     Mental Status: He is alert and oriented to person, place, and time. Mental status is at baseline.  Psychiatric:        Mood and Affect: Mood normal.        Behavior: Behavior normal.       Last depression screening scores PHQ 2/9 Scores 07/21/2020 07/04/2019 02/24/2019  PHQ - 2 Score 2 0 2  PHQ- 9 Score 6 2 8    Last fall risk screening Fall Risk  07/21/2020  Falls in the past year? 0  Number falls in past yr: 0  Injury with Fall? 0  Risk for fall due to : No Fall Risks  Follow up Falls evaluation completed   Last Audit-C alcohol use screening Alcohol Use Disorder Test (AUDIT) 07/21/2020  1. How often do you have a drink containing alcohol? 1  2. How many drinks containing alcohol do you have on a typical day when you are drinking? 0  3. How often do you have six or more drinks on one occasion? 0  AUDIT-C Score 1  Alcohol Brief Interventions/Follow-up AUDIT Score <7 follow-up not indicated   A score of 3 or more in women, and 4 or more in men indicates increased risk for alcohol abuse, EXCEPT if all of the points are from question 1   No results found for any visits on 07/21/20.  Assessment & Plan    Routine Health Maintenance and Physical Exam  Exercise Activities and Dietary recommendations Goals   None     Immunization History  Administered Date(s) Administered  . PFIZER  SARS-COV-2 Vaccination 11/13/2019, 12/04/2019  . Tdap 03/31/2013, 03/31/2016    Health Maintenance  Topic Date Due  . Hepatitis C Screening  Never done  . INFLUENZA VACCINE  11/11/2020 (Originally 03/14/2020)  . TETANUS/TDAP  03/31/2026  . COVID-19 Vaccine  Completed  . HIV Screening  Completed    Discussed health benefits of physical activity, and encouraged him to engage in regular exercise appropriate for his age and condition.  1. Annual physical exam   2. Essential (primary) hypertension  Continue current medications.   - TSH - Lipid panel -  Comprehensive metabolic panel - CBC with Differential/Platelet  3. Anxiety  Continue current medications.  4. Gastroesophageal reflux disease without esophagitis   5. Encounter for hepatitis C screening test for low risk patient  - Hepatitis C Antibody  6. Right foot pain  History of plantar fasciitis  - Ambulatory referral to Podiatry  7. Plantar fasciitis  Refer to specialist.    Return in about 6 months (around 01/19/2021) for hypertension.     ITrinna Post, PA-C, have reviewed all documentation for this visit. The documentation on 07/21/20 for the exam, diagnosis, procedures, and orders are all accurate and complete.  The entirety of the information documented in the History of Present Illness, Review of Systems and Physical Exam were personally obtained by me. Portions of this information were initially documented by [facility] and reviewed by me for thoroughness and accuracy.     Paulene Floor  [facility] [phone] (phone) [phone] (fax)  [place]

## 2020-07-21 NOTE — Patient Instructions (Signed)

## 2020-07-26 ENCOUNTER — Ambulatory Visit (INDEPENDENT_AMBULATORY_CARE_PROVIDER_SITE_OTHER): Payer: PRIVATE HEALTH INSURANCE

## 2020-07-26 ENCOUNTER — Ambulatory Visit: Payer: PRIVATE HEALTH INSURANCE | Admitting: Podiatry

## 2020-07-26 ENCOUNTER — Other Ambulatory Visit: Payer: Self-pay

## 2020-07-26 ENCOUNTER — Encounter: Payer: Self-pay | Admitting: Podiatry

## 2020-07-26 DIAGNOSIS — M722 Plantar fascial fibromatosis: Secondary | ICD-10-CM | POA: Diagnosis not present

## 2020-07-26 MED ORDER — TRIAMCINOLONE ACETONIDE 10 MG/ML IJ SUSP
5.0000 mg | Freq: Once | INTRAMUSCULAR | Status: AC
  Administered 2020-07-26: 16:00:00 5 mg

## 2020-07-26 MED ORDER — MELOXICAM 15 MG PO TABS
15.0000 mg | ORAL_TABLET | Freq: Every day | ORAL | 3 refills | Status: DC
Start: 2020-07-26 — End: 2020-09-06

## 2020-07-26 MED ORDER — DEXAMETHASONE SODIUM PHOSPHATE 4 MG/ML IJ SOLN
2.0000 mg | Freq: Once | INTRAMUSCULAR | Status: AC
  Administered 2020-07-26: 16:00:00 2 mg

## 2020-07-26 NOTE — Progress Notes (Signed)
  Subjective:  Patient ID: Edgar Martinez, male    DOB: [birth year],  MRN: [id number]  Chief Complaint  Patient presents with  . Foot Pain    Patient presents today for right heel pain x years off and on.  He says its bad in the mornings and feels like a bruise when walking.  He has been stretching his foot for relief    34 y.o. male presents with the above complaint. History confirmed with patient.  Works as a Loss adjuster, chartered.  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  Objective:  Physical Exam: warm, good capillary refill, no trophic changes or ulcerative lesions, normal DP and PT pulses and normal sensory exam.  Right Foot: point tenderness over the heel pad   Radiographs: X-ray of the right foot: no fracture, dislocation, swelling or degenerative changes noted and posterior calcaneal spur Assessment:   1. Plantar fasciitis      Plan:  Patient was evaluated and treated and all questions answered.  Discussed the etiology and treatment options for plantar fasciitis including stretching, formal physical therapy, supportive shoegears such as a running shoe or sneaker, pre fabricated orthoses, injection therapy, and oral medications. We also discussed the role of surgical treatment of this for patients who do not improve after exhausting non-surgical treatment options.   -XR reviewed with patient -Educated patient on stretching and icing of the affected limb -Plantar fascial brace dispensed -Injection delivered to the plantar fascia of the right foot. -Rx for meloxicam. Educated on use, risks and benefits of the medication  After sterile prep with povidone-iodine solution and alcohol, the right heel was injected with 0.5cc 2% xylocaine plain, 0.5cc 0.5% marcaine plain, 5mg  triamcinolone acetonide, and 2mg  dexamethasone was injected along  the plantar fascia at the insertion on the plantar calcaneus. The patient tolerated the procedure  well without complication.   Return in about 1 month (around 08/26/2020) for recheck plantar fasciitis.

## 2020-07-26 NOTE — Patient Instructions (Addendum)
Check with your insurance if they cover "custom molded foot orthotics". The codes they will want are: L3020 (procedure code for the orthotics) and M72.2 (plantar fasciitis diagnosis code)      Plantar Fasciitis (Heel Spur Syndrome) with Rehab The plantar fascia is a fibrous, ligament-like, soft-tissue structure that spans the bottom of the foot. Plantar fasciitis is a condition that causes pain in the foot due to inflammation of the tissue. SYMPTOMS   Pain and tenderness on the underneath side of the foot.  Pain that worsens with standing or walking. CAUSES  Plantar fasciitis is caused by irritation and injury to the plantar fascia on the underneath side of the foot. Common mechanisms of injury include:  Direct trauma to bottom of the foot.  Damage to a small nerve that runs under the foot where the main fascia attaches to the heel bone.  Stress placed on the plantar fascia due to bone spurs. RISK INCREASES WITH:   Activities that place stress on the plantar fascia (running, jumping, pivoting, or cutting).  Poor strength and flexibility.  Improperly fitted shoes.  Tight calf muscles.  Flat feet.  Failure to warm-up properly before activity.  Obesity. PREVENTION  Warm up and stretch properly before activity.  Allow for adequate recovery between workouts.  Maintain physical fitness:  Strength, flexibility, and endurance.  Cardiovascular fitness.  Maintain a health body weight.  Avoid stress on the plantar fascia.  Wear properly fitted shoes, including arch supports for individuals who have flat feet.  PROGNOSIS  If treated properly, then the symptoms of plantar fasciitis usually resolve without surgery. However, occasionally surgery is necessary.  RELATED COMPLICATIONS   Recurrent symptoms that may result in a chronic condition.  Problems of the lower back that are caused by compensating for the injury, such as limping.  Pain or weakness of the foot  during push-off following surgery.  Chronic inflammation, scarring, and partial or complete fascia tear, occurring more often from repeated injections.  TREATMENT  Treatment initially involves the use of ice and medication to help reduce pain and inflammation. The use of strengthening and stretching exercises may help reduce pain with activity, especially stretches of the Achilles tendon. These exercises may be performed at home or with a therapist. Your caregiver may recommend that you use heel cups of arch supports to help reduce stress on the plantar fascia. Occasionally, corticosteroid injections are given to reduce inflammation. If symptoms persist for greater than 6 months despite non-surgical (conservative), then surgery may be recommended.   MEDICATION   If pain medication is necessary, then nonsteroidal anti-inflammatory medications, such as aspirin and ibuprofen, or other minor pain relievers, such as acetaminophen, are often recommended.  Do not take pain medication within 7 days before surgery.  Prescription pain relievers may be given if deemed necessary by your caregiver. Use only as directed and only as much as you need.  Corticosteroid injections may be given by your caregiver. These injections should be reserved for the most serious cases, because they may only be given a certain number of times.  HEAT AND COLD  Cold treatment (icing) relieves pain and reduces inflammation. Cold treatment should be applied for 10 to 15 minutes every 2 to 3 hours for inflammation and pain and immediately after any activity that aggravates your symptoms. Use ice packs or massage the area with a piece of ice (ice massage).  Heat treatment may be used prior to performing the stretching and strengthening activities prescribed by your caregiver, physical therapist,  or Product/process development scientist. Use a heat pack or soak the injury in warm water.  SEEK IMMEDIATE MEDICAL CARE IF:  Treatment seems to offer no  benefit, or the condition worsens.  Any medications produce adverse side effects.  EXERCISES- RANGE OF MOTION (ROM) AND STRETCHING EXERCISES - Plantar Fasciitis (Heel Spur Syndrome) These exercises may help you when beginning to rehabilitate your injury. Your symptoms may resolve with or without further involvement from your physician, physical therapist or athletic trainer. While completing these exercises, remember:   Restoring tissue flexibility helps normal motion to return to the joints. This allows healthier, less painful movement and activity.  An effective stretch should be held for at least 30 seconds.  A stretch should never be painful. You should only feel a gentle lengthening or release in the stretched tissue.  RANGE OF MOTION - Toe Extension, Flexion  Sit with your right / left leg crossed over your opposite knee.  Grasp your toes and gently pull them back toward the top of your foot. You should feel a stretch on the bottom of your toes and/or foot.  Hold this stretch for 10 seconds.  Now, gently pull your toes toward the bottom of your foot. You should feel a stretch on the top of your toes and or foot.  Hold this stretch for 10 seconds. Repeat  times. Complete this stretch 3 times per day.   RANGE OF MOTION - Ankle Dorsiflexion, Active Assisted  Remove shoes and sit on a chair that is preferably not on a carpeted surface.  Place right / left foot under knee. Extend your opposite leg for support.  Keeping your heel down, slide your right / left foot back toward the chair until you feel a stretch at your ankle or calf. If you do not feel a stretch, slide your bottom forward to the edge of the chair, while still keeping your heel down.  Hold this stretch for 10 seconds. Repeat 3 times. Complete this stretch 2 times per day.   STRETCH  Gastroc, Standing  Place hands on wall.  Extend right / left leg, keeping the front knee somewhat bent.  Slightly point your  toes inward on your back foot.  Keeping your right / left heel on the floor and your knee straight, shift your weight toward the wall, not allowing your back to arch.  You should feel a gentle stretch in the right / left calf. Hold this position for 10 seconds. Repeat 3 times. Complete this stretch 2 times per day.  STRETCH  Soleus, Standing  Place hands on wall.  Extend right / left leg, keeping the other knee somewhat bent.  Slightly point your toes inward on your back foot.  Keep your right / left heel on the floor, bend your back knee, and slightly shift your weight over the back leg so that you feel a gentle stretch deep in your back calf.  Hold this position for 10 seconds. Repeat 3 times. Complete this stretch 2 times per day.  STRETCH  Gastrocsoleus, Standing  Note: This exercise can place a lot of stress on your foot and ankle. Please complete this exercise only if specifically instructed by your caregiver.   Place the ball of your right / left foot on a step, keeping your other foot firmly on the same step.  Hold on to the wall or a rail for balance.  Slowly lift your other foot, allowing your body weight to press your heel down over the edge  of the step.  You should feel a stretch in your right / left calf.  Hold this position for 10 seconds.  Repeat this exercise with a slight bend in your right / left knee. Repeat 3 times. Complete this stretch 2 times per day.   STRENGTHENING EXERCISES - Plantar Fasciitis (Heel Spur Syndrome)  These exercises may help you when beginning to rehabilitate your injury. They may resolve your symptoms with or without further involvement from your physician, physical therapist or athletic trainer. While completing these exercises, remember:   Muscles can gain both the endurance and the strength needed for everyday activities through controlled exercises.  Complete these exercises as instructed by your physician, physical therapist or  athletic trainer. Progress the resistance and repetitions only as guided.  STRENGTH - Towel Curls  Sit in a chair positioned on a non-carpeted surface.  Place your foot on a towel, keeping your heel on the floor.  Pull the towel toward your heel by only curling your toes. Keep your heel on the floor. Repeat 3 times. Complete this exercise 2 times per day.  STRENGTH - Ankle Inversion  Secure one end of a rubber exercise band/tubing to a fixed object (table, pole). Loop the other end around your foot just before your toes.  Place your fists between your knees. This will focus your strengthening at your ankle.  Slowly, pull your big toe up and in, making sure the band/tubing is positioned to resist the entire motion.  Hold this position for 10 seconds.  Have your muscles resist the band/tubing as it slowly pulls your foot back to the starting position. Repeat 3 times. Complete this exercises 2 times per day.  Document Released: 07/31/2005 Document Revised: 10/23/2011 Document Reviewed: 11/12/2008 [facility] Patient Information 2014 [place], [place].

## 2020-07-27 LAB — COMPREHENSIVE METABOLIC PANEL
ALT: 18 IU/L (ref 0–44)
AST: 19 IU/L (ref 0–40)
Albumin/Globulin Ratio: 1.9 (ref 1.2–2.2)
Albumin: 4.3 g/dL (ref 4.0–5.0)
Alkaline Phosphatase: 50 IU/L (ref 44–121)
BUN/Creatinine Ratio: 7 — ABNORMAL LOW (ref 9–20)
BUN: 10 mg/dL (ref 6–20)
Bilirubin Total: 0.5 mg/dL (ref 0.0–1.2)
CO2: 24 mmol/L (ref 20–29)
Calcium: 9.5 mg/dL (ref 8.7–10.2)
Chloride: 99 mmol/L (ref 96–106)
Creatinine, Ser: 1.38 mg/dL — ABNORMAL HIGH (ref 0.76–1.27)
GFR calc Af Amer: 76 mL/min/{1.73_m2} (ref >59)
GFR calc non Af Amer: 66 mL/min/{1.73_m2} (ref >59)
Globulin, Total: 2.3 g/dL (ref 1.5–4.5)
Glucose: 100 mg/dL — ABNORMAL HIGH (ref 65–99)
Potassium: 4.1 mmol/L (ref 3.5–5.2)
Sodium: 140 mmol/L (ref 134–144)
Total Protein: 6.6 g/dL (ref 6.0–8.5)

## 2020-07-27 LAB — CBC WITH DIFFERENTIAL/PLATELET
Basophils Absolute: 0 10*3/uL (ref 0.0–0.2)
Basos: 1 %
EOS (ABSOLUTE): 0.1 10*3/uL (ref 0.0–0.4)
Eos: 2 %
Hematocrit: 42.7 % (ref 37.5–51.0)
Hemoglobin: 13.8 g/dL (ref 13.0–17.7)
Immature Grans (Abs): 0 10*3/uL (ref 0.0–0.1)
Immature Granulocytes: 0 %
Lymphocytes Absolute: 1.8 10*3/uL (ref 0.7–3.1)
Lymphs: 32 %
MCH: 23.8 pg — ABNORMAL LOW (ref 26.6–33.0)
MCHC: 32.3 g/dL (ref 31.5–35.7)
MCV: 74 fL — ABNORMAL LOW (ref 79–97)
Monocytes Absolute: 0.6 10*3/uL (ref 0.1–0.9)
Monocytes: 10 %
Neutrophils Absolute: 3.1 10*3/uL (ref 1.4–7.0)
Neutrophils: 55 %
Platelets: 232 10*3/uL (ref 150–450)
RBC: 5.79 x10E6/uL (ref 4.14–5.80)
RDW: 13.9 % (ref 11.6–15.4)
WBC: 5.6 10*3/uL (ref 3.4–10.8)

## 2020-07-27 LAB — LIPID PANEL
Chol/HDL Ratio: 4.8 ratio (ref 0.0–5.0)
Cholesterol, Total: 184 mg/dL (ref 100–199)
HDL: 38 mg/dL — ABNORMAL LOW (ref >39)
LDL Chol Calc (NIH): 124 mg/dL — ABNORMAL HIGH (ref 0–99)
Triglycerides: 122 mg/dL (ref 0–149)
VLDL Cholesterol Cal: 22 mg/dL (ref 5–40)

## 2020-07-27 LAB — HEPATITIS C ANTIBODY: Hep C Virus Ab: <0.1 s/co ratio (ref 0.0–0.9)

## 2020-07-27 LAB — TSH: TSH: 0.989 u[IU]/mL (ref 0.450–4.500)

## 2020-09-01 ENCOUNTER — Ambulatory Visit: Payer: PRIVATE HEALTH INSURANCE | Admitting: Podiatry

## 2020-09-06 ENCOUNTER — Other Ambulatory Visit: Payer: Self-pay

## 2020-09-06 ENCOUNTER — Other Ambulatory Visit: Payer: Self-pay | Admitting: Physician Assistant

## 2020-09-06 ENCOUNTER — Ambulatory Visit: Payer: PRIVATE HEALTH INSURANCE | Admitting: Podiatry

## 2020-09-06 DIAGNOSIS — M722 Plantar fascial fibromatosis: Secondary | ICD-10-CM

## 2020-09-06 DIAGNOSIS — F419 Anxiety disorder, unspecified: Secondary | ICD-10-CM

## 2020-09-06 MED ORDER — METHYLPREDNISOLONE 4 MG PO TBPK
ORAL_TABLET | ORAL | 0 refills | Status: DC
Start: 2020-09-06 — End: 2020-09-06

## 2020-09-06 MED ORDER — TRIAMCINOLONE ACETONIDE 40 MG/ML IJ SUSP
10.0000 mg | Freq: Once | INTRAMUSCULAR | Status: AC
  Administered 2020-09-06: 10 mg

## 2020-09-06 MED ORDER — DEXAMETHASONE SODIUM PHOSPHATE 4 MG/ML IJ SOLN
4.0000 mg | Freq: Once | INTRAMUSCULAR | Status: AC
  Administered 2020-09-06: 4 mg

## 2020-09-06 MED ORDER — METHYLPREDNISOLONE 4 MG PO TBPK: ORAL_TABLET | ORAL | 0 refills | Status: DC

## 2020-09-06 NOTE — Telephone Encounter (Signed)
Requested Prescriptions  Pending Prescriptions Disp Refills  . busPIRone (BUSPAR) 15 MG tablet [Pharmacy Med Name: BUSPIRONE 15MG  TABLETS] 90 tablet 1    Sig: TAKE 1 TABLET(15 MG) BY MOUTH TWICE DAILY     Psychiatry: Anxiolytics/Hypnotics - Non-controlled Passed - 09/06/2020  8:46 AM      Passed - Valid encounter within last 6 months    Recent Outpatient Visits          1 month ago Annual physical exam   [facility] Carles Collet M, PA-C   6 months ago Essential hypertension   [place], Wendee Beavers, [place]   1 year ago Essential (primary) hypertension   [facility] [place], Wendee Beavers, PA-C   1 year ago Annual physical exam   [facility] Trinna Post, [place]   1 year ago [place], Wendee Beavers, [place]      Future Appointments            In 4 months Trinna Post, [place], [place]

## 2020-09-06 NOTE — Progress Notes (Signed)
  Subjective:  Patient ID: Edgar Martinez, male    DOB: [birth year],  MRN: [id number]  Chief Complaint  Patient presents with  . Plantar Fasciitis    Right heel pain continues at this time. Injection was a little helpful, but still having discomfort in the same area as last visit. Did not use the Meloxicam RX d/t lab results from PCP who advised against it. Icing the area, stretching and wear PF brace as much as possible.    35 y.o. male returns with the above complaint. History confirmed with patient.  About 40% better he would estimate.  Injection helped some.  Was not able to take meloxicam because his creatinine has been elevated.  Objective:  Physical Exam: warm, good capillary refill, no trophic changes or ulcerative lesions, normal DP and PT pulses and normal sensory exam.  Right Foot: point tenderness over the heel pad   Radiographs: X-ray of the right foot: no fracture, dislocation, swelling or degenerative changes noted and posterior calcaneal spur Assessment:   1. Plantar fasciitis      Plan:  Patient was evaluated and treated and all questions answered.  Discussed the etiology and treatment options for plantar fasciitis including stretching, formal physical therapy, supportive shoegears such as a running shoe or sneaker, pre fabricated orthoses, injection therapy, and oral medications. We also discussed the role of surgical treatment of this for patients who do not improve after exhausting non-surgical treatment options.   -Recommend he continue stretching and icing of the affected limb -Continue use of plantar fascial brace  -Repeat injection delivered to the plantar fascia of the right foot considering the limited oral anti-inflammatory options -Discontinue meloxicam.  Rx for methylprednisolone taper sent to his pharmacy  After sterile prep with povidone-iodine solution and alcohol, the right heel was injected with 1 cc 0.5% marcaine plain, 10 mg triamcinolone  acetonide, and 4 mg dexamethasone was injected along  the plantar fascia at the insertion on the plantar calcaneus. The patient tolerated the procedure well without complication.   Return in about 6 weeks (around 10/18/2020) for recheck plantar fasciitis.

## 2020-09-20 ENCOUNTER — Encounter: Payer: Self-pay | Admitting: Physician Assistant

## 2020-09-29 ENCOUNTER — Other Ambulatory Visit: Payer: Self-pay

## 2020-09-29 ENCOUNTER — Ambulatory Visit (INDEPENDENT_AMBULATORY_CARE_PROVIDER_SITE_OTHER): Payer: PRIVATE HEALTH INSURANCE | Admitting: Orthotics

## 2020-09-29 DIAGNOSIS — M722 Plantar fascial fibromatosis: Secondary | ICD-10-CM | POA: Diagnosis not present

## 2020-09-29 NOTE — Progress Notes (Signed)

## 2020-10-10 ENCOUNTER — Encounter: Payer: Self-pay | Admitting: Physician Assistant

## 2020-10-12 ENCOUNTER — Other Ambulatory Visit: Payer: Self-pay

## 2020-10-12 DIAGNOSIS — F419 Anxiety disorder, unspecified: Secondary | ICD-10-CM

## 2020-10-12 DIAGNOSIS — I10 Essential (primary) hypertension: Secondary | ICD-10-CM

## 2020-10-12 DIAGNOSIS — G4700 Insomnia, unspecified: Secondary | ICD-10-CM

## 2020-10-12 NOTE — Telephone Encounter (Signed)
L.O.V. was on 07/21/2020 and he had a visit scheduled for 01/19/2021. Patient requested a refill and change of pharmacy via [place].

## 2020-10-14 MED ORDER — AMLODIPINE BESYLATE 2.5 MG PO TABS: ORAL_TABLET | ORAL | 1 refills | Status: DC

## 2020-10-14 MED ORDER — TRAZODONE HCL 100 MG PO TABS: ORAL_TABLET | ORAL | 1 refills | Status: DC

## 2020-10-14 MED ORDER — LOSARTAN POTASSIUM-HCTZ 100-25 MG PO TABS: 1.0000 | ORAL_TABLET | Freq: Every day | ORAL | 1 refills | Status: DC

## 2020-10-14 MED ORDER — BUSPIRONE HCL 15 MG PO TABS: ORAL_TABLET | ORAL | 1 refills | Status: DC

## 2020-10-18 ENCOUNTER — Encounter: Payer: Self-pay | Admitting: Podiatry

## 2020-10-18 ENCOUNTER — Ambulatory Visit: Payer: PRIVATE HEALTH INSURANCE | Admitting: Podiatry

## 2020-10-18 ENCOUNTER — Other Ambulatory Visit: Payer: Self-pay

## 2020-10-18 DIAGNOSIS — M722 Plantar fascial fibromatosis: Secondary | ICD-10-CM

## 2020-10-18 NOTE — Progress Notes (Signed)
  Subjective:  Patient ID: Edgar Martinez, male    DOB: November 01, 1985,  MRN: [id number]  Chief Complaint  Patient presents with  . Plantar Fasciitis  . Foot Orthotics    "its not much better and Im hoping the orthotics will help. Some days are better than others.    He is also picking up orthotics today    35 y.o. male returns with the above complaint. History confirmed with patient.  Has not improved much since last visit.  The injection lasted about a week and then went away.  Prednisone taper helped while he was on it but then pain returned afterwards.  Here to pick up his orthotics today  Objective:  Physical Exam: warm, good capillary refill, no trophic changes or ulcerative lesions, normal DP and PT pulses and normal sensory exam.  Right Foot: point tenderness over the heel pad   Radiographs: X-ray of the right foot: no fracture, dislocation, swelling or degenerative changes noted and posterior calcaneal spur Assessment:   1. Plantar fasciitis      Plan:  Patient was evaluated and treated and all questions answered.  Discussed the etiology and treatment options for plantar fasciitis including stretching, formal physical therapy, supportive shoegears such as a running shoe or sneaker, pre fabricated orthoses, injection therapy, and oral medications. We also discussed the role of surgical treatment of this for patients who do not improve after exhausting non-surgical treatment options.   -Recommend he continue stretching and icing of the affected limb -No repeat injection performed today -He did receive his orthotics today, we discussed the break-in period and I assessed them for fit and function.  I think this will help significantly considering the type of job he has. -I discussed with him that this is starting to become more of a chronic issue for him and we have been limited in what types of treatments we can do, he is unable to do physical therapy because of his job demands and  he was unable to take meloxicam because of an elevation in his creatinine.  If he is no much better by next visit which consider something more advanced such as increasing immobilization in a CAM boot, this may mean he potentially could have to go to a reduced activity level for his job will consider MRI or surgical intervention.  He likely would be a good candidate for shockwave EPAT or radiofrequency ablation [place] procedure, we will revisit this if he is not much better after using the orthotics for a few weeks   Return in about 6 weeks (around 11/29/2020).

## 2020-10-18 NOTE — Patient Instructions (Signed)

## 2020-11-10 ENCOUNTER — Encounter: Payer: PRIVATE HEALTH INSURANCE | Admitting: Orthotics

## 2020-11-29 ENCOUNTER — Encounter: Payer: Self-pay | Admitting: Podiatry

## 2020-11-29 ENCOUNTER — Other Ambulatory Visit: Payer: Self-pay

## 2020-11-29 ENCOUNTER — Ambulatory Visit: Payer: PRIVATE HEALTH INSURANCE | Admitting: Podiatry

## 2020-11-29 DIAGNOSIS — M722 Plantar fascial fibromatosis: Secondary | ICD-10-CM

## 2020-12-01 ENCOUNTER — Encounter: Payer: Self-pay | Admitting: Podiatry

## 2020-12-01 NOTE — Progress Notes (Signed)
  Subjective:  Patient ID: Edgar Martinez, male    DOB: [birth year],  MRN: [id number]  Chief Complaint  Patient presents with  . Plantar Fasciitis    "its still painful.  By the end of the day Im hobbling around"    35 y.o. male returns with the above complaint. History confirmed with patient.  Has been wearing his orthotics.  They feel comfortable but have not eliminated the pain  Objective:  Physical Exam: warm, good capillary refill, no trophic changes or ulcerative lesions, normal DP and PT pulses and normal sensory exam.  Right Foot: point tenderness over the heel pad   Radiographs: X-ray of the right foot: no fracture, dislocation, swelling or degenerative changes noted and posterior calcaneal spur Assessment:   1. Plantar fasciitis of left foot      Plan:  Patient was evaluated and treated and all questions answered.  Discussed the etiology and treatment options for plantar fasciitis including stretching, formal physical therapy, supportive shoegears such as a running shoe or sneaker, pre fabricated orthoses, injection therapy, and oral medications. We also discussed the role of surgical treatment of this for patients who do not improve after exhausting non-surgical treatment options.   -Recommend he continue stretching and icing of the affected limb -I think this point he should rest and be out of work for 2 weeks, a work note for this was written.  Recommend he be immobilized in a CAM boot for this.  If still this problematic at next visit or worsening then likely will need MRI and plan for shockwave or surgical treatment  Return in about 6 weeks (around 01/10/2021) for recheck plantar fasciitis.

## 2021-01-07 ENCOUNTER — Telehealth: Payer: Self-pay | Admitting: Physician Assistant

## 2021-01-07 DIAGNOSIS — K219 Gastro-esophageal reflux disease without esophagitis: Secondary | ICD-10-CM

## 2021-01-07 MED ORDER — DEXLANSOPRAZOLE 30 MG PO CPDR: DELAYED_RELEASE_CAPSULE | ORAL | 0 refills | Status: DC

## 2021-01-07 NOTE — Telephone Encounter (Signed)
Refilled

## 2021-01-07 NOTE — Telephone Encounter (Signed)
[facility] faxed refill request for the following medications:  Dexlansoprazole (DEXILANT) 30 MG capsule  90  Day supply Last Rx: 03/02/20 Qty: 90 Refills: 3 LOV: 07/21/20 NOV: 01/25/21 with Dr. Rosanna Randy Please advise. Thanks TNP

## 2021-01-19 ENCOUNTER — Ambulatory Visit: Payer: PRIVATE HEALTH INSURANCE | Admitting: Physician Assistant

## 2021-01-19 ENCOUNTER — Other Ambulatory Visit: Payer: Self-pay

## 2021-01-19 ENCOUNTER — Encounter: Payer: Self-pay | Admitting: Podiatry

## 2021-01-19 ENCOUNTER — Ambulatory Visit: Payer: PRIVATE HEALTH INSURANCE | Admitting: Podiatry

## 2021-01-19 DIAGNOSIS — M722 Plantar fascial fibromatosis: Secondary | ICD-10-CM

## 2021-01-19 DIAGNOSIS — M79671 Pain in right foot: Secondary | ICD-10-CM

## 2021-01-23 NOTE — Progress Notes (Signed)
  Subjective:  Patient ID: Edgar Martinez, male    DOB: August 18, 1985,  MRN: [id number]  Chief Complaint  Patient presents with   Plantar Fasciitis    "its really no better and physical therapy hasn't helped"    35 y.o. male returns with the above complaint. History confirmed with patient.  No change it hasn't gotten any better at all.  Objective:  Physical Exam: warm, good capillary refill, no trophic changes or ulcerative lesions, normal DP and PT pulses and normal sensory exam.  Right Foot: point tenderness over the heel pad   Radiographs: X-ray of the right foot: no fracture, dislocation, swelling or degenerative changes noted and posterior calcaneal spur Assessment:   1. Plantar fasciitis of right foot   2. Right foot pain       Plan:  Patient was evaluated and treated and all questions answered.  Discussed the etiology and treatment options for plantar fasciitis including stretching, formal physical therapy, supportive shoegears such as a running shoe or sneaker, pre fabricated orthoses, injection therapy, and oral medications. We also discussed the role of surgical treatment of this for patients who do not improve after exhausting non-surgical treatment options.   -At this point he has not had any improvement even with immobilization and being off work. We discussed further treatment with non surgical EPAT and/or PRP injection. Also discussed surgical intervention with EPF and possible gastrocnemius recession. Would like to order MRI to evaluate for any bone marrow edema in calcaneus. F/U after MRI  Return in about 5 weeks (around 02/23/2021) for after MRI to review.

## 2021-01-25 ENCOUNTER — Encounter: Payer: Self-pay | Admitting: Family Medicine

## 2021-01-25 ENCOUNTER — Ambulatory Visit: Payer: PRIVATE HEALTH INSURANCE | Admitting: Family Medicine

## 2021-01-25 ENCOUNTER — Other Ambulatory Visit: Payer: Self-pay

## 2021-01-25 VITALS — BP 152/84 | HR 108 | Temp 98.6°F | Resp 16 | Ht 77.0 in | Wt 297.0 lb

## 2021-01-25 DIAGNOSIS — F419 Anxiety disorder, unspecified: Secondary | ICD-10-CM

## 2021-01-25 DIAGNOSIS — I10 Essential (primary) hypertension: Secondary | ICD-10-CM | POA: Diagnosis not present

## 2021-01-25 DIAGNOSIS — K219 Gastro-esophageal reflux disease without esophagitis: Secondary | ICD-10-CM | POA: Diagnosis not present

## 2021-01-25 DIAGNOSIS — Z6835 Body mass index (BMI) 35.0-35.9, adult: Secondary | ICD-10-CM

## 2021-01-25 DIAGNOSIS — G4700 Insomnia, unspecified: Secondary | ICD-10-CM

## 2021-01-25 MED ORDER — TRAZODONE HCL 100 MG PO TABS: ORAL_TABLET | ORAL | 1 refills | Status: DC

## 2021-01-25 MED ORDER — LOSARTAN POTASSIUM-HCTZ 100-25 MG PO TABS: 1.0000 | ORAL_TABLET | Freq: Every day | ORAL | 1 refills | Status: DC

## 2021-01-25 MED ORDER — AMLODIPINE BESYLATE 2.5 MG PO TABS: ORAL_TABLET | ORAL | 1 refills | Status: DC

## 2021-01-25 MED ORDER — BUSPIRONE HCL 15 MG PO TABS: ORAL_TABLET | ORAL | 1 refills | Status: DC

## 2021-01-25 MED ORDER — DEXLANSOPRAZOLE 30 MG PO CPDR: DELAYED_RELEASE_CAPSULE | ORAL | 1 refills | Status: DC

## 2021-01-25 NOTE — Progress Notes (Signed)
Established patient visit   Patient: Edgar Martinez   DOB: 01/23/1986   35 y.o. 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  He has been married for 29 years and has a 21 year old daughter and 90-year-old son.  Well and exercises regularly.  Home blood pressures are reading 130s over 80s.  He has no complaints.  He is taking his medications. Hypertension, follow-up  BP Readings from Last 3 Encounters:  01/25/21 (!) 152/84  07/21/20 135/90  03/08/20 (!) 145/97   Wt Readings from Last 3 Encounters:  01/25/21 297 lb (134.7 kg)  07/21/20 (!) 305 lb (138.3 kg)  03/02/20 (!) 302 lb 3.2 oz (137.1 kg)     He was last seen for hypertension 6 months ago.  BP at that visit was 135/90. Management since that visit includes no medication changes.  He reports good compliance with treatment. He is not having side effects.  He is following a Regular diet. He is exercising. He does not smoke.  Use of agents associated with hypertension: none.   Outside blood pressures are checked occasionally. Symptoms: No chest pain No chest pressure  No palpitations No syncope  No dyspnea No orthopnea  No paroxysmal nocturnal dyspnea No lower extremity edema   Pertinent labs: Lab Results  Component Value Date   CHOL 184 07/26/2020   HDL 38 (L) 07/26/2020   LDLCALC 124 (H) 07/26/2020   TRIG 122 07/26/2020   CHOLHDL 4.8 07/26/2020   Lab Results  Component Value Date   NA 140 07/26/2020   K 4.1 07/26/2020   CREATININE 1.38 (H) 07/26/2020   GFRNONAA 66 07/26/2020   GFRAA 76 07/26/2020   GLUCOSE 100 (H) 07/26/2020     The ASCVD Risk score (Goff DC Jr., et al., 2013) failed to calculate for the following reasons:   The 2013 ASCVD risk score is only valid for ages 36 to 7        Medications: Outpatient Medications Prior to Visit  Medication Sig   amLODipine (NORVASC) 2.5 MG tablet TAKE 1 TABLET(2.5 MG) BY MOUTH DAILY   busPIRone (BUSPAR) 15 MG tablet TAKE 1 TABLET(15 MG) BY MOUTH TWICE DAILY   Dexlansoprazole (DEXILANT) 30 MG capsule TAKE 1 CAPSULE(30 MG) BY MOUTH DAILY   losartan-hydrochlorothiazide (HYZAAR) 100-25 MG tablet Take 1 tablet by mouth daily.   ondansetron (ZOFRAN) 4 MG tablet TAKE 1 TABLET(4 MG) BY MOUTH EVERY 8 HOURS AS NEEDED FOR NAUSEA OR VOMITING   traZODone (DESYREL) 100 MG tablet TAKE 1 TABLET(100 MG) BY MOUTH AT BEDTIME   methylPREDNISolone (MEDROL DOSEPAK) 4 MG TBPK tablet 6 day dose pack - take as directed (Patient not taking: Reported on 01/25/2021)   sildenafil (REVATIO) 20 MG tablet TAKE 1-5 TABLETS BY MOUTH 1 HOUR PRIOR TO INTERCOURSE (Patient not taking: No sig reported)   No facility-administered medications prior to visit.    Review of Systems  Constitutional:  Negative for activity change and fatigue.  Respiratory:  Negative for cough and shortness of breath.   Cardiovascular:  Negative for chest pain, palpitations and leg swelling.  Musculoskeletal:  Negative for arthralgias and myalgias.  Neurological:  Negative for dizziness, light-headedness and headaches.  Psychiatric/Behavioral:  Negative for self-injury, sleep disturbance and suicidal ideas. The patient is not nervous/anxious.  Objective    BP (!) 152/84   Pulse (!) 108   Temp 98.6 F (37 C)   Resp 16   Ht 6\' 5"  (1.956 m)   Wt 297 lb (134.7 kg)   BMI 35.22 kg/m  BP Readings from Last 3 Encounters:  01/25/21 (!) 152/84  07/21/20 135/90  03/08/20 (!) 145/97   Wt Readings from Last 3 Encounters:  01/25/21 297 lb (134.7 kg)  07/21/20 (!) 305 lb (138.3 kg)  03/02/20 (!) 302 lb 3.2 oz (137.1 kg)       Physical Exam Vitals reviewed.  Constitutional:      Appearance: Normal appearance.  HENT:     Head: Normocephalic and atraumatic.     Right Ear:  External ear normal.     Left Ear: External ear normal.  Eyes:     General: No scleral icterus.    Conjunctiva/sclera: Conjunctivae normal.  Cardiovascular:     Rate and Rhythm: Normal rate and regular rhythm.     Pulses: Normal pulses.     Heart sounds: Normal heart sounds.  Pulmonary:     Effort: Pulmonary effort is normal.     Breath sounds: Normal breath sounds.  Skin:    General: Skin is warm and dry.  Neurological:     General: No focal deficit present.     Mental Status: He is alert and oriented to person, place, and time.  Psychiatric:        Mood and Affect: Mood normal.        Behavior: Behavior normal.        Thought Content: Thought content normal.        Judgment: Judgment normal.      No results found for any visits on 01/25/21.  Assessment & Plan     1. Essential hypertension Blood pressures are good so we will continue to advise patient to work on diet and exercise which is discussed.  May need to increase amlodipine to 5 mg on next visit - amLODipine (NORVASC) 2.5 MG tablet; TAKE 1 TABLET(2.5 MG) BY MOUTH DAILY  Dispense: 90 tablet; Refill: 1 - losartan-hydrochlorothiazide (HYZAAR) 100-25 MG tablet; Take 1 tablet by mouth daily.  Dispense: 90 tablet; Refill: 1  2. Anxiety Controlled - busPIRone (BUSPAR) 15 MG tablet; TAKE 1 TABLET(15 MG) BY MOUTH TWICE DAILY  Dispense: 90 tablet; Refill: 1  3. Gastroesophageal reflux disease without esophagitis Consider weaning or cutting back on Dexilant to every other day - Dexlansoprazole (DEXILANT) 30 MG capsule; TAKE 1 CAPSULE(30 MG) BY MOUTH DAILY  Dispense: 90 capsule; Refill: 1  4. Insomnia, unspecified type  - traZODone (DESYREL) 100 MG tablet; TAKE 1 TABLET(100 MG) BY MOUTH AT BEDTIME  Dispense: 90 tablet; Refill: 1  5. Class 2 severe obesity due to excess calories with serious comorbidity and body mass index (BMI) of 35.0 to 35.9 in adult [facility]) Diet and exercise discussed with goal waist size of 36  inches.   No follow-ups on file.      I, Wilhemena Durie, MD, have reviewed all documentation for this visit. The documentation on 01/29/21 for the exam, diagnosis, procedures, and orders are all accurate and complete.    Lavora Brisbon Cranford Mon, MD  [facility] [phone] (phone) [phone] (fax)  [place]

## 2021-02-22 ENCOUNTER — Other Ambulatory Visit: Payer: Self-pay

## 2021-02-22 ENCOUNTER — Ambulatory Visit
Admission: RE | Admit: 2021-02-22 | Discharge: 2021-02-22 | Disposition: A | Discharge status: Home / Self Care | Payer: BC Managed Care – PPO | Source: Ambulatory Visit | Attending: Podiatry | Admitting: Podiatry

## 2021-02-22 DIAGNOSIS — M722 Plantar fascial fibromatosis: Secondary | ICD-10-CM | POA: Diagnosis not present

## 2021-02-22 DIAGNOSIS — M7989 Other specified soft tissue disorders: Secondary | ICD-10-CM | POA: Diagnosis not present

## 2021-02-22 DIAGNOSIS — M25471 Effusion, right ankle: Secondary | ICD-10-CM | POA: Diagnosis not present

## 2021-02-22 DIAGNOSIS — M79671 Pain in right foot: Secondary | ICD-10-CM

## 2021-02-22 DIAGNOSIS — S96911A Strain of unspecified muscle and tendon at ankle and foot level, right foot, initial encounter: Secondary | ICD-10-CM | POA: Diagnosis not present

## 2021-03-02 ENCOUNTER — Encounter: Payer: Self-pay | Admitting: Podiatry

## 2021-03-02 ENCOUNTER — Ambulatory Visit: Payer: BC Managed Care – PPO | Admitting: Podiatry

## 2021-03-02 ENCOUNTER — Other Ambulatory Visit: Payer: Self-pay

## 2021-03-02 DIAGNOSIS — M722 Plantar fascial fibromatosis: Secondary | ICD-10-CM | POA: Diagnosis not present

## 2021-03-02 NOTE — Progress Notes (Signed)
Subjective:  Patient ID: Edgar Martinez, male    DOB: 05/22/1986,  MRN: [id number]  Chief Complaint  Patient presents with   Plantar Fasciitis    Right foot, 5 week follow up    35 y.o. male returns with the above complaint. History confirmed with patient.  He completed the MRI he is hoping a plan for surgery in mid October now  Objective:  Physical Exam: warm, good capillary refill, no trophic changes or ulcerative lesions, normal DP and PT pulses and normal sensory exam.  Right Foot: point tenderness over the heel pad   Radiographs: X-ray of the right foot: no fracture, dislocation, swelling or degenerative changes noted and posterior calcaneal spur  Study Result  Narrative & Impression  CLINICAL DATA:  Chronic right heel pain.  No prior surgery.   EXAM: MR OF THE RIGHT HEEL WITHOUT CONTRAST   TECHNIQUE: Multiplanar, multisequence MR imaging of the right ankle was performed. No intravenous contrast was administered.   COMPARISON:  Right foot x-rays dated July 26, 2020.   FINDINGS: TENDONS   Peroneal: Peroneal longus tendon intact. Peroneal brevis intact.   Posteromedial: Posterior tibial tendon intact. Flexor digitorum longus tendon intact. Flexor hallucis longus tendon intact.   Anterior: Tibialis anterior tendon intact. Extensor hallucis longus tendon intact Extensor digitorum longus tendon intact.   Achilles:  Intact.   Plantar Fascia: Intact. Thickening of the proximal central and lateral bands. Focal marrow edema at the calcaneal origin with small enthesophyte.   LIGAMENTS   Lateral: Anterior talofibular ligament intact. Calcaneofibular ligament intact. Posterior talofibular ligament intact. Anterior and posterior tibiofibular ligaments intact.   Medial: Deltoid ligament intact. Spring ligament intact.   CARTILAGE   Ankle Joint: Trace joint effusion. Normal ankle mortise. No chondral defect.   Subtalar Joints/Sinus Tarsi: Normal subtalar  joints. No subtalar joint effusion. Normal sinus tarsi.   Bones: No marrow signal abnormality.  No fracture or dislocation.   Soft Tissue: Medial hindfoot soft tissue swelling. No soft tissue mass or fluid collection. Mild edema in the distal flexor hallucis longus muscle.   IMPRESSION: 1. Mild plantar fasciitis. No tear. 2. Mild strain of the distal flexor hallucis longus muscle.     Electronically Signed   By: Titus Dubin M.D.   On: 02/23/2021 12:48   Assessment:   1. Plantar fasciitis of right foot       Plan:  Patient was evaluated and treated and all questions answered.  Discussed the etiology and treatment options for plantar fasciitis including stretching, formal physical therapy, supportive shoegears such as a running shoe or sneaker, pre fabricated orthoses, injection therapy, and oral medications. We also discussed the role of surgical treatment of this for patients who do not improve after exhausting non-surgical treatment options.   -MRI findings reviewed with the patient, we discussed further treatment with nonsurgical care with PRP and EPAT versus EPF and PRP.  He has mild equinus but not severe enough to warrant a gastrocnemius recession at this point.  Can revisit this in the future if required.  He has opted for endoscopic plantar fasciotomy.  We will combine this with a PRP injection at the time of surgery.  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  I will see him back in September for final presurgical planning visit which will sign consent and address any final questions.  For now continue therapeutic exercises  and orthotics  Return in about 10 weeks (around 05/11/2021).

## 2021-04-22 DIAGNOSIS — Z20822 Contact with and (suspected) exposure to covid-19: Secondary | ICD-10-CM | POA: Diagnosis not present

## 2021-04-28 ENCOUNTER — Telehealth: Payer: Self-pay | Admitting: Urology

## 2021-04-28 NOTE — Telephone Encounter (Signed)
DOS - 05/27/21  EPF RIGHT --- [id number]   [employer] EFFECTIVE DATE - 02/11/21   PLAN DEDUCTIBLE - $4,000.00 W/ $4,000.00 REMAINING OUT OF POCKET - $6,500.00 W/ $6,260.31 REMAINING COINSURANCE - 30% COPAY - $0.00   NO PRIOR AUTH REQUIRED

## 2021-05-11 ENCOUNTER — Ambulatory Visit: Payer: BC Managed Care – PPO | Admitting: Podiatry

## 2021-05-17 ENCOUNTER — Ambulatory Visit: Payer: BC Managed Care – PPO | Admitting: Podiatry

## 2021-05-17 ENCOUNTER — Other Ambulatory Visit: Payer: Self-pay

## 2021-05-17 DIAGNOSIS — M722 Plantar fascial fibromatosis: Secondary | ICD-10-CM | POA: Diagnosis not present

## 2021-05-18 NOTE — Progress Notes (Signed)
Subjective:  Patient ID: Edgar Martinez, male    DOB: [birth year],  MRN: [id number]  Chief Complaint  Patient presents with   Plantar Fasciitis    SURGERY CONSULT/SX 05/27/2021    35 y.o. male returns with the above complaint. History confirmed with patient.  Surgery scheduled for October 14 but still quite painful even with his change in work duties  Objective:  Physical Exam: warm, good capillary refill, no trophic changes or ulcerative lesions, normal DP and PT pulses and normal sensory exam.  Right Foot: point tenderness over the heel pad   Radiographs: X-ray of the right foot: no fracture, dislocation, swelling or degenerative changes noted and posterior calcaneal spur  Study Result  Narrative & Impression  CLINICAL DATA:  Chronic right heel pain.  No prior surgery.   EXAM: MR OF THE RIGHT HEEL WITHOUT CONTRAST   TECHNIQUE: Multiplanar, multisequence MR imaging of the right ankle was performed. No intravenous contrast was administered.   COMPARISON:  Right foot x-rays dated July 26, 2020.   FINDINGS: TENDONS   Peroneal: Peroneal longus tendon intact. Peroneal brevis intact.   Posteromedial: Posterior tibial tendon intact. Flexor digitorum longus tendon intact. Flexor hallucis longus tendon intact.   Anterior: Tibialis anterior tendon intact. Extensor hallucis longus tendon intact Extensor digitorum longus tendon intact.   Achilles:  Intact.   Plantar Fascia: Intact. Thickening of the proximal central and lateral bands. Focal marrow edema at the calcaneal origin with small enthesophyte.   LIGAMENTS   Lateral: Anterior talofibular ligament intact. Calcaneofibular ligament intact. Posterior talofibular ligament intact. Anterior and posterior tibiofibular ligaments intact.   Medial: Deltoid ligament intact. Spring ligament intact.   CARTILAGE   Ankle Joint: Trace joint effusion. Normal ankle mortise. No chondral defect.   Subtalar Joints/Sinus  Tarsi: Normal subtalar joints. No subtalar joint effusion. Normal sinus tarsi.   Bones: No marrow signal abnormality.  No fracture or dislocation.   Soft Tissue: Medial hindfoot soft tissue swelling. No soft tissue mass or fluid collection. Mild edema in the distal flexor hallucis longus muscle.   IMPRESSION: 1. Mild plantar fasciitis. No tear. 2. Mild strain of the distal flexor hallucis longus muscle.     Electronically Signed   By: Titus Dubin M.D.   On: 02/23/2021 12:48   Assessment:   1. Plantar fasciitis of right foot       Plan:  Patient was evaluated and treated and all questions answered.  We can discussed surgical intervention for his chronic Planter fasciitis.  He is scheduled for endoscopic plantar fasciotomy and a PRP injection of the plantar fascia.  We discussed all the risks benefits and potential complications including but not limited to pain, swelling, infection, scar, numbness which may be temporary or permanent, chronic pain, stiffness, nerve pain or damage, wound healing problems.  We discussed the postoperative course and the amount of rest at work and home for 2 weeks and then light duty for 4 weeks after this.  Hopeful to have him out of the boot within 4 weeks.  His orthotics also need refurbishment as the time goes, enlargement we will do this today no cost to him.  He will get me these at surgery.   Surgical plan:  Procedure: -Right foot EPF and PRP injection  Location: -[facility]  Anesthesia plan: -Local anesthesia with IV sedation  Postoperative pain plan: - Tylenol 1000 mg every 6 hours, ibuprofen 600 mg every 6 hours, gabapentin 300 mg every 8 hours x5 days, oxycodone 5 mg 1-2  tabs every 6 hours only as needed  DVT prophylaxis: -None required  WB Restrictions / DME needs: -WBAT in CAM boot he has this already   No follow-ups on file.

## 2021-05-26 ENCOUNTER — Encounter: Payer: Self-pay | Admitting: Podiatry

## 2021-05-27 ENCOUNTER — Other Ambulatory Visit: Payer: Self-pay | Admitting: Podiatry

## 2021-05-27 DIAGNOSIS — M722 Plantar fascial fibromatosis: Secondary | ICD-10-CM | POA: Diagnosis not present

## 2021-05-27 MED ORDER — ACETAMINOPHEN 500 MG PO TABS
1000.0000 mg | ORAL_TABLET | Freq: Four times a day (QID) | ORAL | 0 refills | Status: AC | PRN
Start: 2021-05-27 — End: 2021-06-10

## 2021-05-27 MED ORDER — GABAPENTIN 300 MG PO CAPS
300.0000 mg | ORAL_CAPSULE | Freq: Three times a day (TID) | ORAL | 0 refills | Status: DC
Start: 2021-05-27 — End: 2021-07-26

## 2021-05-27 MED ORDER — OXYCODONE HCL 5 MG PO TABS: 5.0000 mg | ORAL_TABLET | ORAL | 0 refills | Status: AC | PRN

## 2021-05-30 ENCOUNTER — Encounter: Payer: Self-pay | Admitting: Podiatry

## 2021-06-01 ENCOUNTER — Ambulatory Visit (INDEPENDENT_AMBULATORY_CARE_PROVIDER_SITE_OTHER): Payer: BC Managed Care – PPO | Admitting: Podiatry

## 2021-06-01 ENCOUNTER — Other Ambulatory Visit: Payer: Self-pay

## 2021-06-01 DIAGNOSIS — M722 Plantar fascial fibromatosis: Secondary | ICD-10-CM

## 2021-06-01 NOTE — Progress Notes (Signed)
  Subjective:  Patient ID: Edgar Martinez, male    DOB: 13-Jan-1986,  MRN: [id number]  Chief Complaint  Patient presents with   Routine Post Op      POV #1 DOS 05/27/2021 EPF RT     35 y.o. male returns for post-op check.  Doing well pain is improving  Review of Systems: Negative except as noted in the HPI. Denies N/V/F/Ch.   Objective:  There were no vitals filed for this visit. There is no height or weight on file to calculate BMI. Constitutional Well developed. Well nourished.  Vascular Foot warm and well perfused. Capillary refill normal to all digits.   Neurologic Normal speech. Oriented to person, place, and time. Epicritic sensation to light touch grossly present bilaterally.  Dermatologic Skin healing well without signs of infection. Skin edges well coapted without signs of infection.  Orthopedic: Tenderness to palpation noted about the surgical site.    Assessment:   1. Plantar fasciitis of right foot    Plan:  Patient was evaluated and treated and all questions answered.  S/p foot surgery right -Progressing as expected post-operatively. -XR: None taken -WB Status: WBAT in CAM boot -Sutures: Removed at next visit. -Medications: No refills required -Foot redressed with Ace wrap.  May begin bathing does not dressing.  Return in about 2 weeks (around 06/15/2021) for suture removal, post op (no x-rays).

## 2021-06-15 ENCOUNTER — Ambulatory Visit (INDEPENDENT_AMBULATORY_CARE_PROVIDER_SITE_OTHER): Payer: BC Managed Care – PPO | Admitting: Podiatry

## 2021-06-15 ENCOUNTER — Other Ambulatory Visit: Payer: Self-pay

## 2021-06-15 DIAGNOSIS — M722 Plantar fascial fibromatosis: Secondary | ICD-10-CM

## 2021-06-19 NOTE — Progress Notes (Signed)
  Subjective:  Patient ID: Edgar Martinez, male    DOB: 03-28-1986,  MRN: [id number]  Chief Complaint  Patient presents with   Routine Post Op     POV #2 DOS 05/27/2021 EPF RT     35 y.o. male returns for post-op check.  Doing well pain is improving  Review of Systems: Negative except as noted in the HPI. Denies N/V/F/Ch.   Objective:  There were no vitals filed for this visit. There is no height or weight on file to calculate BMI. Constitutional Well developed. Well nourished.  Vascular Foot warm and well perfused. Capillary refill normal to all digits.   Neurologic Normal speech. Oriented to person, place, and time. Epicritic sensation to light touch grossly present bilaterally.  Dermatologic Skin healing well without signs of infection. Skin edges well coapted without signs of infection.  Orthopedic: Tenderness to palpation noted about the surgical site.    Assessment:   1. Plantar fasciitis of right foot     Plan:  Patient was evaluated and treated and all questions answered.  S/p foot surgery right -Sutures removed today.  He can transition back to regular shoe gear when he tolerates it.  Advised to continue exercises  Return in about 3 weeks (around 07/06/2021) for post op (no x-rays).

## 2021-07-06 ENCOUNTER — Ambulatory Visit (INDEPENDENT_AMBULATORY_CARE_PROVIDER_SITE_OTHER): Payer: BC Managed Care – PPO | Admitting: Podiatry

## 2021-07-06 ENCOUNTER — Other Ambulatory Visit: Payer: Self-pay

## 2021-07-06 ENCOUNTER — Encounter: Payer: Self-pay | Admitting: Podiatry

## 2021-07-06 DIAGNOSIS — M722 Plantar fascial fibromatosis: Secondary | ICD-10-CM

## 2021-07-06 NOTE — Progress Notes (Signed)
  Subjective:  Patient ID: Edgar Martinez, male    DOB: 1985/12/03,  MRN: [id number]  Chief Complaint  Patient presents with   Routine Post Op    POV #3 DOS 05/27/2021 EPF RT pick up orthotics     35 y.o. male returns for post-op check.  Doing well pain is improving, still feels a pull and some scar tissue, he has some tenderness in the ball of the foot  Review of Systems: Negative except as noted in the HPI. Denies N/V/F/Ch.   Objective:  There were no vitals filed for this visit. There is no height or weight on file to calculate BMI. Constitutional Well developed. Well nourished.  Vascular Foot warm and well perfused. Capillary refill normal to all digits.   Neurologic Normal speech. Oriented to person, place, and time. Epicritic sensation to light touch grossly present bilaterally.  Dermatologic Incisions are well healed  Orthopedic: Minimal tenderness to palpation noted about the surgical site.    Assessment:   No diagnosis found.   Plan:  Patient was evaluated and treated and all questions answered.  S/p foot surgery right -Overall doing well and has made good progress. His refurbished orthotics were ready today and I dispensed these, hopefully should hold up longer term. He should continue gradual increase in activity and I advised that some intermittent pain and stiffness is expected. He will return to see me as needed for this or other issues   Return if symptoms worsen or fail to improve.

## 2021-07-26 ENCOUNTER — Ambulatory Visit (INDEPENDENT_AMBULATORY_CARE_PROVIDER_SITE_OTHER): Payer: BC Managed Care – PPO | Admitting: Family Medicine

## 2021-07-26 ENCOUNTER — Other Ambulatory Visit: Payer: Self-pay

## 2021-07-26 ENCOUNTER — Encounter: Payer: Self-pay | Admitting: Family Medicine

## 2021-07-26 VITALS — BP 135/85 | HR 85 | Temp 98.5°F | Resp 16 | Ht 77.0 in | Wt 318.0 lb

## 2021-07-26 DIAGNOSIS — Z6836 Body mass index (BMI) 36.0-36.9, adult: Secondary | ICD-10-CM

## 2021-07-26 DIAGNOSIS — G4700 Insomnia, unspecified: Secondary | ICD-10-CM | POA: Diagnosis not present

## 2021-07-26 DIAGNOSIS — F419 Anxiety disorder, unspecified: Secondary | ICD-10-CM

## 2021-07-26 DIAGNOSIS — Z0000 Encounter for general adult medical examination without abnormal findings: Secondary | ICD-10-CM | POA: Diagnosis not present

## 2021-07-26 DIAGNOSIS — K219 Gastro-esophageal reflux disease without esophagitis: Secondary | ICD-10-CM | POA: Diagnosis not present

## 2021-07-26 DIAGNOSIS — N529 Male erectile dysfunction, unspecified: Secondary | ICD-10-CM

## 2021-07-26 DIAGNOSIS — Z13220 Encounter for screening for lipoid disorders: Secondary | ICD-10-CM | POA: Diagnosis not present

## 2021-07-26 DIAGNOSIS — I10 Essential (primary) hypertension: Secondary | ICD-10-CM | POA: Diagnosis not present

## 2021-07-26 MED ORDER — AMLODIPINE BESYLATE 2.5 MG PO TABS: ORAL_TABLET | ORAL | 1 refills | Status: DC

## 2021-07-26 MED ORDER — DEXLANSOPRAZOLE 30 MG PO CPDR: DELAYED_RELEASE_CAPSULE | ORAL | 1 refills | Status: DC

## 2021-07-26 MED ORDER — LOSARTAN POTASSIUM-HCTZ 100-25 MG PO TABS: 1.0000 | ORAL_TABLET | Freq: Every day | ORAL | 1 refills | Status: DC

## 2021-07-26 MED ORDER — FLUCONAZOLE 150 MG PO TABS
150.0000 mg | ORAL_TABLET | Freq: Once | ORAL | 1 refills | Status: AC
Start: 2021-07-26 — End: 2021-07-26

## 2021-07-26 MED ORDER — BUSPIRONE HCL 15 MG PO TABS: ORAL_TABLET | ORAL | 1 refills | Status: DC

## 2021-07-26 MED ORDER — TRAZODONE HCL 100 MG PO TABS: ORAL_TABLET | ORAL | 1 refills | Status: DC

## 2021-07-26 NOTE — Assessment & Plan Note (Signed)
Doing well on current regimen, no changes made today. 

## 2021-07-26 NOTE — Assessment & Plan Note (Signed)
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 Doing well on dexilant, refill sent.

## 2021-07-26 NOTE — Progress Notes (Signed)
BP 135/85 (BP Location: Right Arm, Patient Position: Sitting, Cuff Size: Large)    Pulse 85    Temp 98.5 F (36.9 C) (Temporal)    Resp 16    Ht 6\' 5"  (1.956 m)    Wt (!) 318 lb (144.2 kg)    SpO2 96%    BMI 37.71 kg/m    Subjective:    Patient ID: Edgar Martinez, male    DOB: 05-23-1986, 35 y.o.   MRN: [id number]  HPI: Edgar Martinez is a 35 y.o. male presenting on 07/26/2021 for comprehensive medical examination. Current medical complaints include:none  Hypertension: - Medications: amlodipine, losartan-HCTZ - Compliance: good - Checking BP at home: occasionally - Denies any SOB, CP, vision changes, medication SEs, or symptoms of hypotension - occasionally LE swelling.   GERD - Meds: dexilant (has been out for a little while) - previously on protonix 80mg , nexium - Symptoms:  heartburn (ran out recently) - Denies denies dysphagia has not lost weight denies melena, hematochezia, hematemesis, and coffee ground emesis.  - Previous treatment: antacids, H2 antagonists, and proton pump inhibitors.  Anxiety - Medications: buspar - Taking: every night, occasionally will take additional during day.  - Counseling: no - Previous hospitalizations: no - Symptoms: general anxiousness - Current stressors: work Licensed conveyancer) - Coping Mechanisms: not identified  GAD 7 : Generalized Anxiety Score 02/24/2019  Nervous, Anxious, on Edge 2  Control/stop worrying 2  Worry too much - different things 1  Trouble relaxing 1  Restless 1  Easily annoyed or irritable 2  Afraid - awful might happen 2  Total GAD 7 Score 11  Anxiety Difficulty Somewhat difficult    Insomnia - on trazodone with good effect. No grogginess.  Penile itching - wife with yeast infection. No concerns for STI. Monogamous.  He currently lives with: Interim Problems from his last visit: no  Depression Screen done today and results listed below:  Depression screen [facility] 2/9 07/26/2021 07/21/2020 07/04/2019 02/24/2019  08/24/2017  Decreased Interest 0 1 0 0 0  Down, Depressed, Hopeless 1 1 0 2 0  PHQ - 2 Score 1 2 0 2 0  Altered sleeping 1 2 1 2 1   Tired, decreased energy 2 1 1 2 2   Change in appetite 0 0 0 1 0  Feeling bad or failure about yourself  1 1 0 1 0  Trouble concentrating 0 0 0 0 0  Moving slowly or fidgety/restless 0 0 0 0 0  Suicidal thoughts 0 0 0 0 0  PHQ-9 Score 5 6 2 8 3   Difficult doing work/chores Not difficult at all Not difficult at all Not difficult at all Somewhat difficult Not difficult at all    Past Medical History:  Past Medical History:  Diagnosis Date   Chicken pox    GERD (gastroesophageal reflux disease)    Hypertension     Surgical History:  Past Surgical History:  Procedure Laterality Date   HERNIA REPAIR  2012   Bilateral inguinal hernia with mesh.   VASECTOMY     2015   WRIST SURGERY  2015    Medications:  Current Outpatient Medications on File Prior to Visit  Medication Sig   sildenafil (REVATIO) 20 MG tablet TAKE 1-5 TABLETS BY MOUTH 1 HOUR PRIOR TO INTERCOURSE (Patient not taking: Reported on 07/21/2020)   No current facility-administered medications on file prior to visit.    Allergies:  No Known Allergies  Social History:  Social History   Socioeconomic  History   Marital status: Married    Spouse name: Not on file   Number of children: Not on file   Years of education: Not on file   Highest education level: Not on file  Occupational History   Not on file  Tobacco Use   Smoking status: Former    Packs/day: 0.50    Years: 3.00    Pack years: 1.50    Types: Cigarettes    Quit date: 2010    Years since quitting: 12.9   Smokeless tobacco: Never  Vaping Use   Vaping Use: Never used  Substance and Sexual Activity   Alcohol use: Yes   Drug use: No   Sexual activity: Yes    Partners: Female  Other Topics Concern   Not on file  Social History Narrative   Not on file   Social Determinants of Health   Financial Resource Strain:  Not on file  Food Insecurity: Not on file  Transportation Needs: Not on file  Physical Activity: Not on file  Stress: Not on file  Social Connections: Not on file  Intimate Partner Violence: Not on file   Social History   Tobacco Use  Smoking Status Former   Packs/day: 0.50   Years: 3.00   Pack years: 1.50   Types: Cigarettes   Quit date: 2010   Years since quitting: 12.9  Smokeless Tobacco Never   Social History   Substance and Sexual Activity  Alcohol Use Yes    Family History:  Family History  Problem Relation Age of Onset   Hyperlipidemia Mother    Hypertension Mother    Hypothyroidism Mother    Hyperlipidemia Father    Hypertension Father    Heart disease Father    Diabetes Maternal Grandfather    Heart attack Maternal Grandfather    Lung cancer Paternal Grandfather    CVA Maternal Grandmother    Heart attack Maternal Grandmother    Stomach cancer Paternal Grandmother     Past medical history, surgical history, medications, allergies, family history and social history reviewed with patient today and changes made to appropriate areas of the chart.      Objective:    BP 135/85 (BP Location: Right Arm, Patient Position: Sitting, Cuff Size: Large)    Pulse 85    Temp 98.5 F (36.9 C) (Temporal)    Resp 16    Ht 6\' 5"  (1.956 m)    Wt (!) 318 lb (144.2 kg)    SpO2 96%    BMI 37.71 kg/m   Wt Readings from Last 3 Encounters:  07/26/21 (!) 318 lb (144.2 kg)  01/25/21 297 lb (134.7 kg)  07/21/20 (!) 305 lb (138.3 kg)    Physical Exam Constitutional:      Appearance: He is obese. He is not ill-appearing.  HENT:     Right Ear: External ear normal.     Left Ear: External ear normal.  Eyes:     Extraocular Movements: Extraocular movements intact.  Cardiovascular:     Rate and Rhythm: Normal rate and regular rhythm.     Heart sounds: Normal heart sounds. No murmur heard. Pulmonary:     Effort: No respiratory distress.     Breath sounds: Normal breath sounds.   Abdominal:     General: Bowel sounds are normal. There is no distension.     Palpations: Abdomen is soft.     Tenderness: There is no abdominal tenderness.  Musculoskeletal:  General: Normal range of motion.     Comments: Trace LE edema b/l, R>L  Skin:    General: Skin is warm and dry.  Neurological:     Mental Status: He is alert and oriented to person, place, and time. Mental status is at baseline.  Psychiatric:        Mood and Affect: Mood normal.        Behavior: Behavior normal.        Thought Content: Thought content normal.    Results for orders placed or performed in visit on 07/21/20  TSH  Result Value Ref Range   TSH 0.989 0.450 - 4.500 uIU/mL  Lipid panel  Result Value Ref Range   Cholesterol, Total 184 100 - 199 mg/dL   Triglycerides 122 0 - 149 mg/dL   HDL 38 (L) >39 mg/dL   VLDL Cholesterol Cal 22 5 - 40 mg/dL   LDL Chol Calc (NIH) 124 (H) 0 - 99 mg/dL   Chol/HDL Ratio 4.8 0.0 - 5.0 ratio  Comprehensive metabolic panel  Result Value Ref Range   Glucose 100 (H) 65 - 99 mg/dL   BUN 10 6 - 20 mg/dL   Creatinine, Ser 1.38 (H) 0.76 - 1.27 mg/dL   GFR calc non Af Amer 66 >59 mL/min/1.73   GFR calc Af Amer 76 >59 mL/min/1.73   BUN/Creatinine Ratio 7 (L) 9 - 20   Sodium 140 134 - 144 mmol/L   Potassium 4.1 3.5 - 5.2 mmol/L   Chloride 99 96 - 106 mmol/L   CO2 24 20 - 29 mmol/L   Calcium 9.5 8.7 - 10.2 mg/dL   Total Protein 6.6 6.0 - 8.5 g/dL   Albumin 4.3 4.0 - 5.0 g/dL   Globulin, Total 2.3 1.5 - 4.5 g/dL   Albumin/Globulin Ratio 1.9 1.2 - 2.2   Bilirubin Total 0.5 0.0 - 1.2 mg/dL   Alkaline Phosphatase 50 44 - 121 IU/L   AST 19 0 - 40 IU/L   ALT 18 0 - 44 IU/L  CBC with Differential/Platelet  Result Value Ref Range   WBC 5.6 3.4 - 10.8 x10E3/uL   RBC 5.79 4.14 - 5.80 x10E6/uL   Hemoglobin 13.8 13.0 - 17.7 g/dL   Hematocrit 42.7 37.5 - 51.0 %   MCV 74 (L) 79 - 97 fL   MCH 23.8 (L) 26.6 - 33.0 pg   MCHC 32.3 31.5 - 35.7 g/dL   RDW 13.9 11.6 -  15.4 %   Platelets 232 150 - 450 x10E3/uL   Neutrophils 55 Not Estab. %   Lymphs 32 Not Estab. %   Monocytes 10 Not Estab. %   Eos 2 Not Estab. %   Basos 1 Not Estab. %   Neutrophils Absolute 3.1 1.4 - 7.0 x10E3/uL   Lymphocytes Absolute 1.8 0.7 - 3.1 x10E3/uL   Monocytes Absolute 0.6 0.1 - 0.9 x10E3/uL   EOS (ABSOLUTE) 0.1 0.0 - 0.4 x10E3/uL   Basophils Absolute 0.0 0.0 - 0.2 x10E3/uL   Immature Granulocytes 0 Not Estab. %   Immature Grans (Abs) 0.0 0.0 - 0.1 x10E3/uL  Hepatitis C Antibody  Result Value Ref Range   Hep C Virus Ab <0.1 0.0 - 0.9 s/co ratio      Assessment & Plan:   Problem List Items Addressed This Visit       Cardiovascular and Mediastinum   Essential hypertension    Improved. Refills sent. Obtaining labs.       Relevant Medications   amLODipine (NORVASC)  2.5 MG tablet   losartan-hydrochlorothiazide (HYZAAR) 100-25 MG tablet     Digestive   GERD (gastroesophageal reflux disease)    Refractory to many PPIs, H2 blockers, antacids. Doing well on dexilant, refill sent.      Relevant Medications   Dexlansoprazole (DEXILANT) 30 MG capsule DR     Other   Annual physical exam - Primary   Relevant Orders   Comprehensive metabolic panel   Lipid panel   Hemoglobin A1c   Erectile dysfunction    Doing well on current regimen, no changes made today.      Anxiety    Doing well on current regimen, no changes made today.      Relevant Medications   busPIRone (BUSPAR) 15 MG tablet   traZODone (DESYREL) 100 MG tablet   Class 2 severe obesity with serious comorbidity and body mass index (BMI) of 36.0 to 36.9 in adult [facility])    Contributing to HTN. Encouraged efforts to lose weight. Obtaining labs.      Other Visit Diagnoses     Insomnia, unspecified type       Relevant Medications   traZODone (DESYREL) 100 MG tablet       LABORATORY TESTING:  Health maintenance labs ordered today as discussed above.    IMMUNIZATIONS:   - Tdap: Tetanus  vaccination status reviewed: last tetanus booster within 10 years. - Influenza: Refused - Pneumococcal: Not applicable - HPV: Not applicable - Shingrix vaccine: Not applicable - COVID vaccine: has received 2 doses of mRNA vaccine  SCREENING: - Colonoscopy: Not applicable  Discussed with patient purpose of the colonoscopy is to detect colon cancer at curable precancerous or early stages   - AAA Screening: Not applicable  - Lung cancer screening: n/a  Hep C Screening: UTD STD testing and prevention (HIV/chl/gon/syphilis): UTD Sexual History: monogamous Incontinence Symptoms:   PATIENT COUNSELING:    Advanced Care Planning: A voluntary discussion about advance care planning including the explanation and discussion of advance directives.  Discussed health care proxy and Living will, and the patient was able to identify a health care proxy as wife, Romaldo Saville.  Patient does not have a living will at present time. If patient does have living will, I have requested they bring this to the clinic to be scanned in to their chart.  Sexuality: Discussed sexually transmitted diseases, partner selection, use of condoms, avoidance of unintended pregnancy  and contraceptive alternatives.   Advised to avoid cigarette smoking.  I discussed with the patient that most people either abstain from alcohol or drink within safe limits (<=14/week and <=4 drinks/occasion for males, <=7/weeks and <= 3 drinks/occasion for females) and that the risk for alcohol disorders and other health effects rises proportionally with the number of drinks per week and how often a drinker exceeds daily limits.  Discussed cessation/primary prevention of drug use and availability of treatment for abuse.   Diet: Encouraged to adjust caloric intake to maintain  or achieve ideal body weight, to reduce intake of dietary saturated fat and total fat, to limit sodium intake by avoiding high sodium foods and not adding table salt, and  to maintain adequate dietary potassium and calcium preferably from fresh fruits, vegetables, and low-fat dairy products.    Stressed the importance of regular exercise.  Injury prevention: Discussed safety belts, safety helmets, smoke detector, smoking near bedding or upholstery.   Dental health: Discussed importance of regular tooth brushing, flossing, and dental visits.   Follow up plan: NEXT PREVENTATIVE PHYSICAL DUE  IN 1 YEAR. Return in about 6 months (around 01/24/2022).

## 2021-07-26 NOTE — Patient Instructions (Signed)
Things to do to keep yourself healthy  - Exercise at least 30-45 minutes a day, 3-4 days a week.  - Eat a low-fat diet with lots of fruits and vegetables, up to 7-9 servings per day.  - Seatbelts can save your life. Wear them always.  - Smoke detectors on every level of your home, check batteries every year.  - Eye Doctor - have an eye exam every 1-2 years  - Safe sex - if you may be exposed to STDs, use a condom.  - Alcohol -  If you drink, do it moderately, less than 2 drinks per day.  - Health Care Power of Attorney. Choose someone to speak for you if you are not able. https://www.prepareforyourcare.org is a great website to help you navigate this. - Depression is common in our stressful world.If you're feeling down or losing interest in things you normally enjoy, please come in for a visit.  - Violence - If anyone is threatening or hurting you, please call immediately.  Here is an example of what a healthy plate looks like:    ? Make half your plate fruits and vegetables.     ? Focus on whole fruits.     ? Vary your veggies.  ? Make half your grains whole grains. -     ? Look for the word "whole" at the beginning of the ingredients list    ? Some whole-grain ingredients include whole oats, whole-wheat flour,        whole-grain corn, whole-grain brown rice, and whole rye.  ? Move to low-fat and fat-free milk or yogurt.  ? Vary your protein routine. - Meat, fish, poultry (chicken, turkey), eggs, beans (kidney, pinto), dairy.  ? Drink and eat less sodium, saturated fat, and added sugars.  

## 2021-07-26 NOTE — Assessment & Plan Note (Signed)
Contributing to HTN. Encouraged efforts to lose weight. Obtaining labs.

## 2021-07-26 NOTE — Assessment & Plan Note (Signed)
Improved. Refills sent. Obtaining labs.

## 2021-07-29 LAB — COMPREHENSIVE METABOLIC PANEL
ALT: 15 IU/L (ref 0–44)
AST: 14 IU/L (ref 0–40)
Albumin/Globulin Ratio: 2.2 (ref 1.2–2.2)
Albumin: 4.9 g/dL (ref 4.0–5.0)
Alkaline Phosphatase: 55 IU/L (ref 44–121)
BUN/Creatinine Ratio: 9 (ref 9–20)
BUN: 11 mg/dL (ref 6–20)
Bilirubin Total: 0.3 mg/dL (ref 0.0–1.2)
CO2: 21 mmol/L (ref 20–29)
Calcium: 9.9 mg/dL (ref 8.7–10.2)
Chloride: 101 mmol/L (ref 96–106)
Creatinine, Ser: 1.27 mg/dL (ref 0.76–1.27)
Globulin, Total: 2.2 g/dL (ref 1.5–4.5)
Glucose: 100 mg/dL — ABNORMAL HIGH (ref 70–99)
Potassium: 3.8 mmol/L (ref 3.5–5.2)
Sodium: 140 mmol/L (ref 134–144)
Total Protein: 7.1 g/dL (ref 6.0–8.5)
eGFR: 75 mL/min/{1.73_m2} (ref >59)

## 2021-07-29 LAB — HEMOGLOBIN A1C
Est. average glucose Bld gHb Est-mCnc: 123 mg/dL
Hgb A1c MFr Bld: 5.9 % — ABNORMAL HIGH (ref 4.8–5.6)

## 2021-07-29 LAB — LIPID PANEL
Chol/HDL Ratio: 4.7 ratio (ref 0.0–5.0)
Cholesterol, Total: 180 mg/dL (ref 100–199)
HDL: 38 mg/dL — ABNORMAL LOW (ref >39)
LDL Chol Calc (NIH): 116 mg/dL — ABNORMAL HIGH (ref 0–99)
Triglycerides: 143 mg/dL (ref 0–149)
VLDL Cholesterol Cal: 26 mg/dL (ref 5–40)

## 2021-08-10 ENCOUNTER — Encounter: Payer: PRIVATE HEALTH INSURANCE | Admitting: Family Medicine

## 2021-08-17 ENCOUNTER — Ambulatory Visit: Payer: PRIVATE HEALTH INSURANCE | Admitting: Podiatry

## 2022-01-23 NOTE — Progress Notes (Unsigned)
[facility]      Established patient visit  I,[name],acting as a scribe for Edgar Durie, MD.,have documented all relevant documentation on the behalf of Edgar Durie, MD,as directed by  Edgar Durie, MD while in the presence of Edgar Durie, MD.   Patient: Edgar Martinez   DOB: April 22, 1986   36 y.o. Male  MRN: [id number] Visit Date: 01/24/2022  Today's healthcare provider: Wilhemena Durie, MD   Chief Complaint  Patient presents with   Follow-up   Hypertension   Subjective    HPI  Patient comes in today for follow-up of several medical problems.  He states insurance will no longer cover it is Dexilant but he has breakthrough reflux if he  does not take.  He does remember that he has failed omeprazole and pantoprazole. 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  He has a son and a daughter that are very active with their extracurricular activities outside of school He is having problems losing weight and has chronic fatigue and some edema.  The edema is started with the onset of use of amlodipine for his blood pressure.  He is so busy that he has had trouble finding time to exercise regularly.  Hypertension, follow-up  BP Readings from Last 3 Encounters:  01/24/22 (!) 137/92  07/26/21 135/85  01/25/21 (!) 152/84   Wt Readings from Last 3 Encounters:  01/24/22 (!) 320 lb (145.2 kg)  07/26/21 (!) 318 lb (144.2 kg)  01/25/21 297 lb (134.7 kg)     He was last seen for hypertension 6 months ago.  Management since that visit includes Improved. Refills sent. Obtaining labs. .  Outside blood pressures are checks occasionally.  Pertinent labs Lab Results  Component Value Date   CHOL 180 07/26/2021   HDL 38 (L) 07/26/2021   LDLCALC 116 (H) 07/26/2021   TRIG 143 07/26/2021   CHOLHDL 4.7 07/26/2021   Lab Results  Component Value Date   NA 140 07/26/2021   K 3.8 07/26/2021    CREATININE 1.27 07/26/2021   EGFR 75 07/26/2021   GLUCOSE 100 (H) 07/26/2021   TSH 0.989 07/26/2020     The ASCVD Risk score (Arnett DK, et al., 2019) failed to calculate for the following reasons:   The 2019 ASCVD risk score is only valid for ages 31 to 56  ---------------------------------------------------------------------------------------------------   Medications: Outpatient Medications Prior to Visit  Medication Sig   amLODipine (NORVASC) 2.5 MG tablet TAKE 1 TABLET(2.5 MG) BY MOUTH DAILY   busPIRone (BUSPAR) 15 MG tablet TAKE 1 TABLET(15 MG) BY MOUTH TWICE DAILY   Dexlansoprazole (DEXILANT) 30 MG capsule DR TAKE 1 CAPSULE(30 MG) BY MOUTH DAILY   losartan-hydrochlorothiazide (HYZAAR) 100-25 MG tablet Take 1 tablet by mouth daily.   traZODone (DESYREL) 100 MG tablet TAKE 1 TABLET(100 MG) BY MOUTH AT BEDTIME   sildenafil (REVATIO) 20 MG tablet TAKE 1-5 TABLETS BY MOUTH 1 HOUR PRIOR TO INTERCOURSE (Patient not taking: Reported on 07/21/2020)   No facility-administered medications prior to visit.    Review of Systems  Constitutional:  Negative for appetite change, chills and fever.  Respiratory:  Negative for chest tightness, shortness of breath and wheezing.   Cardiovascular:  Negative for chest pain and palpitations.  Gastrointestinal:  Negative for abdominal pain, nausea and vomiting.    Last thyroid functions Lab Results  Component Value Date   TSH 1.010 01/24/2022  Objective    BP (!) 137/92 (BP Location: Left Arm, Patient Position: Sitting, Cuff Size: Large)   Pulse 66   Resp 16   Ht 6' 5"  (1.956 m)   Wt (!) 320 lb (145.2 kg)   SpO2 95%   BMI 37.95 kg/m  BP Readings from Last 3 Encounters:  01/24/22 (!) 137/92  07/26/21 135/85  01/25/21 (!) 152/84   Wt Readings from Last 3 Encounters:  01/24/22 (!) 320 lb (145.2 kg)  07/26/21 (!) 318 lb (144.2 kg)  01/25/21 297 lb (134.7 kg)      Physical Exam Vitals reviewed.  Constitutional:       Appearance: He is obese. He is not ill-appearing.  HENT:     Right Ear: External ear normal.     Left Ear: External ear normal.  Eyes:     Extraocular Movements: Extraocular movements intact.  Cardiovascular:     Rate and Rhythm: Normal rate and regular rhythm.     Heart sounds: Normal heart sounds. No murmur heard. Pulmonary:     Effort: No respiratory distress.     Breath sounds: Normal breath sounds.  Abdominal:     General: Bowel sounds are normal. There is no distension (.diagmed).     Palpations: Abdomen is soft.     Tenderness: There is no abdominal tenderness.  Musculoskeletal:        General: Normal range of motion.     Comments: Trace LE edema b/l, R>L  Skin:    General: Skin is warm and dry.  Neurological:     Mental Status: He is alert and oriented to person, place, and time. Mental status is at baseline.  Psychiatric:        Mood and Affect: Mood normal.        Behavior: Behavior normal.        Thought Content: Thought content normal.       No results found for any visits on 01/24/22.  Assessment & Plan     1. Essential hypertension At this time stop amlodipine and follow-up in about 3 months.  Check home blood pressure readings.  Continue losartan HCTZ but hopefully with weight loss his blood pressure will also come down.   2. Anxiety Stable at present  3. Gastroesophageal reflux disease without esophagitis 6 finish Dexilant and then try lansoprazole 30 mg - CBC w/Diff/Platelet  4. Class 2 severe obesity due to excess calories with serious comorbidity and body mass index (BMI) of 35.0 to 35.9 in adult (HCC) Weight loss with diet and exercise discussed.  Goal waist size would be 38-40 in this patient who is 6 foot 5 inches - HgB A1c - TSH  5. Chronic fatigue Testosterone level but I think regular exercise will help him with this. - CBC w/Diff/Platelet - HgB A1c - Testosterone - TSH 6.  Lower extremity edema Stop amlodipine and work on regular  exercise.  No follow-ups on file.      I, Edgar Durie, MD, have reviewed all documentation for this visit. The documentation on 01/25/22 for the exam, diagnosis, procedures, and orders are all accurate and complete.    Yuritzi Kamp Cranford Mon, MD  [facility] [phone] (phone) [phone] (fax)  [place]

## 2022-01-24 ENCOUNTER — Ambulatory Visit: Payer: BC Managed Care – PPO | Admitting: Family Medicine

## 2022-01-24 ENCOUNTER — Encounter: Payer: Self-pay | Admitting: Family Medicine

## 2022-01-24 VITALS — BP 137/92 | HR 66 | Resp 16 | Ht 77.0 in | Wt 320.0 lb

## 2022-01-24 DIAGNOSIS — F419 Anxiety disorder, unspecified: Secondary | ICD-10-CM

## 2022-01-24 DIAGNOSIS — Z6835 Body mass index (BMI) 35.0-35.9, adult: Secondary | ICD-10-CM

## 2022-01-24 DIAGNOSIS — R5382 Chronic fatigue, unspecified: Secondary | ICD-10-CM

## 2022-01-24 DIAGNOSIS — E66812 Morbid (severe) obesity due to excess calories: Secondary | ICD-10-CM

## 2022-01-24 DIAGNOSIS — K219 Gastro-esophageal reflux disease without esophagitis: Secondary | ICD-10-CM

## 2022-01-24 DIAGNOSIS — I10 Essential (primary) hypertension: Secondary | ICD-10-CM | POA: Diagnosis not present

## 2022-01-24 MED ORDER — PHENTERMINE HCL 37.5 MG PO CAPS: 37.5000 mg | ORAL_CAPSULE | ORAL | 3 refills | Status: DC

## 2022-01-24 MED ORDER — LANSOPRAZOLE 30 MG PO CPDR: 30.0000 mg | DELAYED_RELEASE_CAPSULE | Freq: Two times a day (BID) | ORAL | 3 refills | Status: AC

## 2022-01-24 NOTE — Patient Instructions (Signed)
Call [employer] at work for counseling regarding stress. Try exercising first thing in the morning. Finish Dexilant and then switch to lansoprazole. Stop amlodipine.

## 2022-01-25 ENCOUNTER — Other Ambulatory Visit: Payer: Self-pay | Admitting: Family Medicine

## 2022-01-25 DIAGNOSIS — F419 Anxiety disorder, unspecified: Secondary | ICD-10-CM

## 2022-01-25 LAB — CBC WITH DIFFERENTIAL/PLATELET
Basophils Absolute: 0 10*3/uL (ref 0.0–0.2)
Basos: 1 %
EOS (ABSOLUTE): 0.1 10*3/uL (ref 0.0–0.4)
Eos: 2 %
Hematocrit: 42.7 % (ref 37.5–51.0)
Hemoglobin: 13.4 g/dL (ref 13.0–17.7)
Immature Grans (Abs): 0 10*3/uL (ref 0.0–0.1)
Immature Granulocytes: 0 %
Lymphocytes Absolute: 1.8 10*3/uL (ref 0.7–3.1)
Lymphs: 33 %
MCH: 23.2 pg — ABNORMAL LOW (ref 26.6–33.0)
MCHC: 31.4 g/dL — ABNORMAL LOW (ref 31.5–35.7)
MCV: 74 fL — ABNORMAL LOW (ref 79–97)
Monocytes Absolute: 0.5 10*3/uL (ref 0.1–0.9)
Monocytes: 9 %
Neutrophils Absolute: 3.1 10*3/uL (ref 1.4–7.0)
Neutrophils: 55 %
Platelets: 239 10*3/uL (ref 150–450)
RBC: 5.77 x10E6/uL (ref 4.14–5.80)
RDW: 14.9 % (ref 11.6–15.4)
WBC: 5.4 10*3/uL (ref 3.4–10.8)

## 2022-01-25 LAB — TSH: TSH: 1.01 u[IU]/mL (ref 0.450–4.500)

## 2022-01-25 LAB — HEMOGLOBIN A1C
Est. average glucose Bld gHb Est-mCnc: 117 mg/dL
Hgb A1c MFr Bld: 5.7 % — ABNORMAL HIGH (ref 4.8–5.6)

## 2022-01-25 LAB — TESTOSTERONE: Testosterone: 417 ng/dL (ref 264–916)

## 2022-01-26 ENCOUNTER — Other Ambulatory Visit: Payer: Self-pay | Admitting: Family Medicine

## 2022-01-26 DIAGNOSIS — K219 Gastro-esophageal reflux disease without esophagitis: Secondary | ICD-10-CM

## 2022-01-26 NOTE — Telephone Encounter (Signed)
Requested Prescriptions  Pending Prescriptions Disp Refills  . Dexlansoprazole (DEXILANT) 30 MG capsule DR [facility] [place] Med Name: [place] DR 30 MG CAPSULE] 90 capsule 1    Sig: TAKE ONE CAPSULE BY MOUTH DAILY     Gastroenterology: Proton Pump Inhibitors Passed - 01/26/2022  6:22 AM      Passed - Valid encounter within last 12 months    Recent Outpatient Visits          2 days ago Essential hypertension   [facility] Jerrol Banana., MD   6 months ago Annual physical exam   [facility] Myles Gip, DO   1 year ago Class 2 severe obesity due to excess calories with serious comorbidity and body mass index (BMI) of 35.0 to 35.9 in adult [facility])   [facility] Jerrol Banana., MD   1 year ago Annual physical exam   [facility] Trinna Post, [place]   1 year ago Essential hypertension   [facility] Trinna Post, [place]      Future Appointments            In 4 months Jerrol Banana., MD [facility], [place]   In 6 months Jerrol Banana., MD [facility], [place]

## 2022-01-26 NOTE — Telephone Encounter (Signed)
Requested Prescriptions  Pending Prescriptions Disp Refills  . busPIRone (BUSPAR) 15 MG tablet [Pharmacy Med Name: busPIRone HCL 15 MG TABLET] 90 tablet 1    Sig: TAKE ONE TABLET BY MOUTH TWICE A DAY     Psychiatry: Anxiolytics/Hypnotics - Non-controlled Passed - 01/25/2022  8:43 PM      Passed - Valid encounter within last 12 months    Recent Outpatient Visits          2 days ago Essential hypertension   [facility] Jerrol Banana., MD   6 months ago Annual physical exam   [facility] Myles Gip, DO   1 year ago Class 2 severe obesity due to excess calories with serious comorbidity and body mass index (BMI) of 35.0 to 35.9 in adult [facility])   [facility] Jerrol Banana., MD   1 year ago Annual physical exam   [facility] Trinna Post, [place]   1 year ago Essential hypertension   [facility] [place] [facility], [place]      Future Appointments            In 4 months Jerrol Banana., MD [facility], [place]   In 6 months Jerrol Banana., MD [facility], [place]

## 2022-02-16 ENCOUNTER — Other Ambulatory Visit: Payer: Self-pay | Admitting: Family Medicine

## 2022-02-16 DIAGNOSIS — I10 Essential (primary) hypertension: Secondary | ICD-10-CM

## 2022-02-16 NOTE — Telephone Encounter (Signed)
Requested Prescriptions  Pending Prescriptions Disp Refills  . amLODipine (NORVASC) 2.5 MG tablet [Pharmacy Med Name: amLODIPine BESYLATE 2.5 MG TAB] 90 tablet 1    Sig: TAKE ONE TABLET BY MOUTH DAILY     Cardiovascular: Calcium Channel Blockers 2 Failed - 02/16/2022  6:22 AM      Failed - Last BP in normal range    BP Readings from Last 1 Encounters:  01/24/22 (!) 137/92         Passed - Last Heart Rate in normal range    Pulse Readings from Last 1 Encounters:  01/24/22 66         Passed - Valid encounter within last 6 months    Recent Outpatient Visits          3 weeks ago Essential hypertension   [facility] Jerrol Banana., MD   6 months ago Annual physical exam   [facility] Myles Gip, DO   1 year ago Class 2 severe obesity due to excess calories with serious comorbidity and body mass index (BMI) of 35.0 to 35.9 in adult [facility])   [facility] Jerrol Banana., MD   1 year ago Annual physical exam   [facility] Trinna Post, [place]   1 year ago Essential hypertension   [facility] Trinna Post, [place]      Future Appointments            In 3 months Jerrol Banana., MD [facility], [place]   In 5 months Jerrol Banana., MD [facility], [place]

## 2022-03-25 ENCOUNTER — Other Ambulatory Visit: Payer: Self-pay | Admitting: Family Medicine

## 2022-03-25 DIAGNOSIS — G4700 Insomnia, unspecified: Secondary | ICD-10-CM

## 2022-03-27 NOTE — Telephone Encounter (Signed)
Requested Prescriptions  Pending Prescriptions Disp Refills  . traZODone (DESYREL) 100 MG tablet [Pharmacy Med Name: traZODone 100 MG TABLET] 90 tablet 1    Sig: TAKE ONE TABLET BY MOUTH EVERY NIGHT AT BEDTIME     Psychiatry: Antidepressants - Serotonin Modulator Passed - 03/25/2022  3:38 PM      Passed - Valid encounter within last 6 months    Recent Outpatient Visits          2 months ago Essential hypertension   [facility] Jerrol Banana., MD   8 months ago Annual physical exam   [facility] Myles Gip, DO   1 year ago Class 2 severe obesity due to excess calories with serious comorbidity and body mass index (BMI) of 35.0 to 35.9 in adult [facility])   [facility] Jerrol Banana., MD   1 year ago Annual physical exam   [facility] Carles Collet M, [place]   2 years ago Essential hypertension   [facility] Trinna Post, [place]      Future Appointments            In 2 months Jerrol Banana., MD [facility], [place]   In 4 months Jerrol Banana., MD [facility], [place]

## 2022-05-15 DIAGNOSIS — M9903 Segmental and somatic dysfunction of lumbar region: Secondary | ICD-10-CM | POA: Diagnosis not present

## 2022-05-15 DIAGNOSIS — M9904 Segmental and somatic dysfunction of sacral region: Secondary | ICD-10-CM | POA: Diagnosis not present

## 2022-05-15 DIAGNOSIS — M9901 Segmental and somatic dysfunction of cervical region: Secondary | ICD-10-CM | POA: Diagnosis not present

## 2022-05-15 DIAGNOSIS — M9902 Segmental and somatic dysfunction of thoracic region: Secondary | ICD-10-CM | POA: Diagnosis not present

## 2022-05-21 IMAGING — MR MR HEEL *R* W/O CM
5 series · 40 of 40 positions shown · non-contrast
Comparison: Right foot x-rays dated July 26, 2020.

CLINICAL DATA: Chronic right heel pain.  No prior surgery.

EXAM:
MR OF THE RIGHT HEEL WITHOUT CONTRAST
TECHNIQUE: Multiplanar, multisequence MR imaging of the right ankle was
performed. No intravenous contrast was administered.

[Series 3: T2 fat-sat · axial · 3.0mm · 0.59mm/px · z∈[-99,+84]mm · 11 of 47 slices shown (1 of 2)]
[im 1/47]
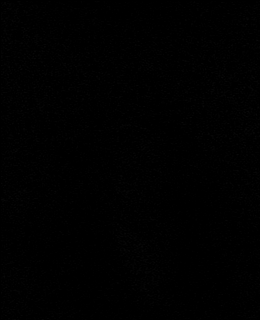
[im 5/47]
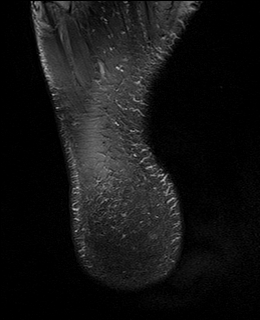
[im 10/47]
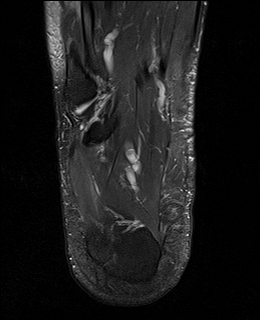
[im 14/47]
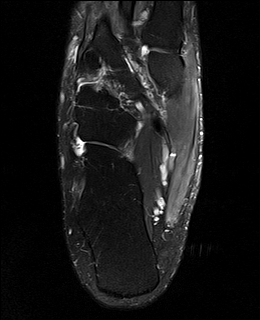
[im 19/47]
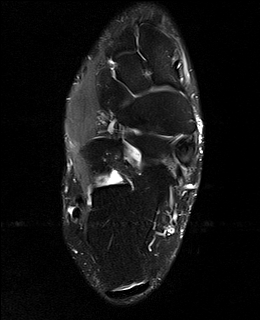
[im 24/47]
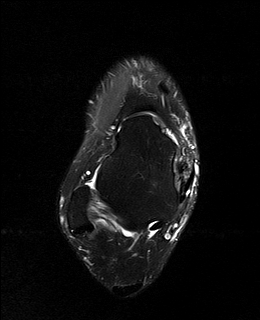
[im 28/47]
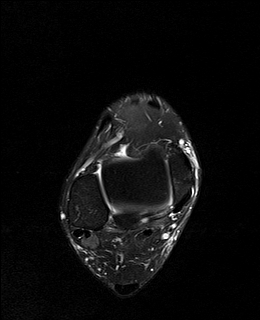
[im 33/47]
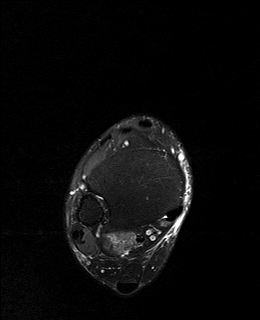
[im 37/47]
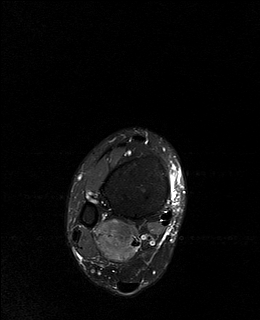
[im 42/47]
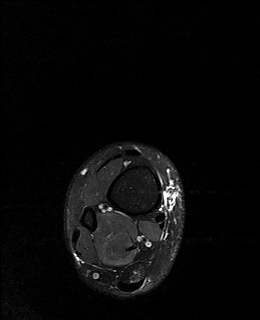
[im 47/47]
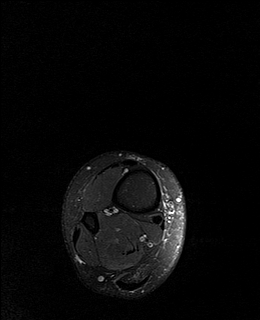

[Series 4: PD fat-sat · axial · 3.0mm · 0.59mm/px · z∈[-99,+84]mm · 10 of 47 slices shown]
[im 1/47]
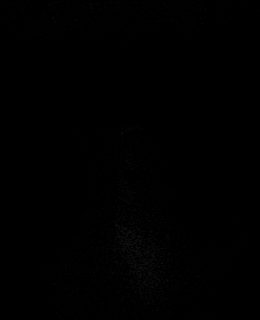
[im 6/47]
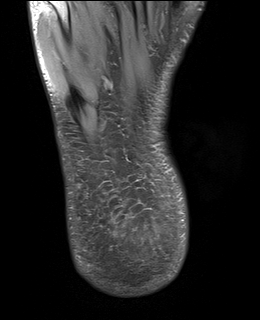
[im 11/47]
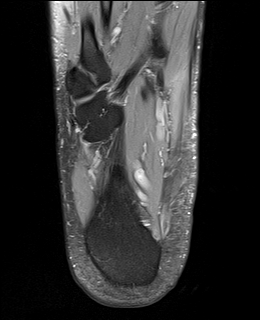
[im 16/47]
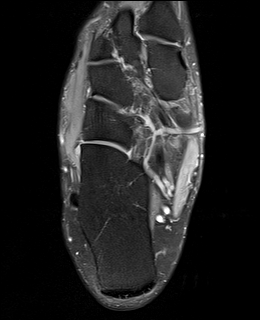
[im 21/47]
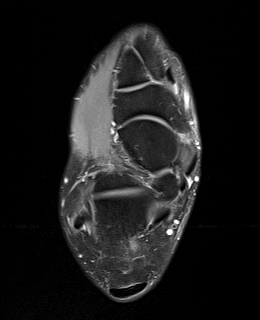
[im 26/47]
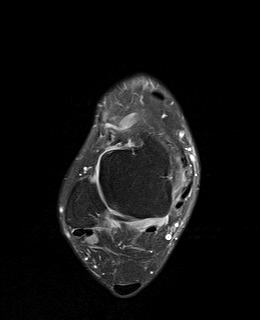
[im 31/47]
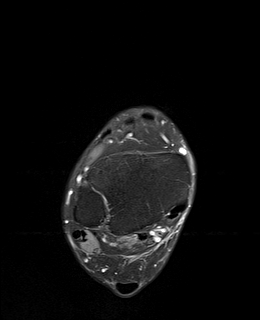
[im 36/47]
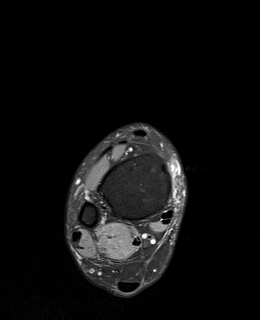
[im 41/47]
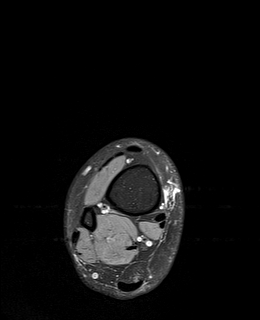
[im 47/47]
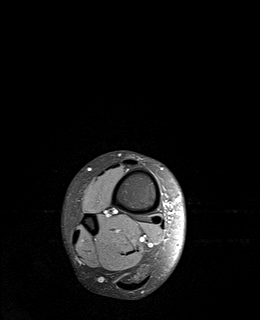

[Series 5: T2 fat-sat · coronal · 3.0mm · 0.74mm/px · 12 of 55 slices shown (2 of 2)]
[im 1/55]
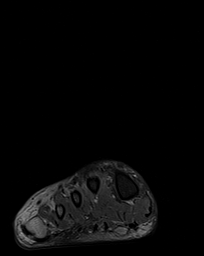
[im 5/55]
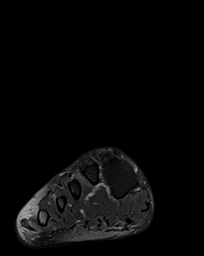
[im 10/55]
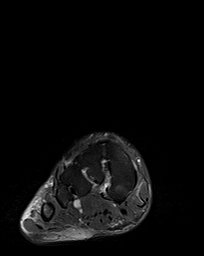
[im 15/55]
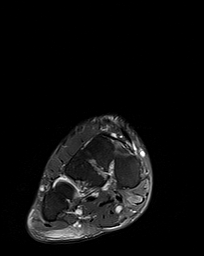
[im 20/55]
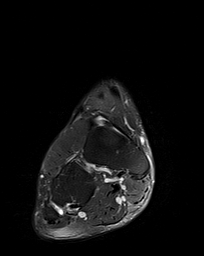
[im 25/55]
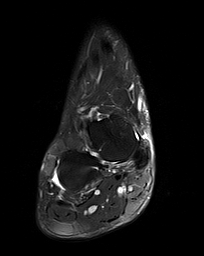
[im 30/55]
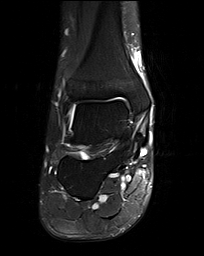
[im 35/55]
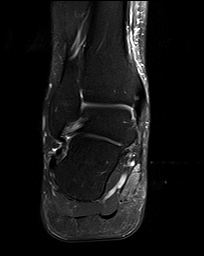
[im 40/55]
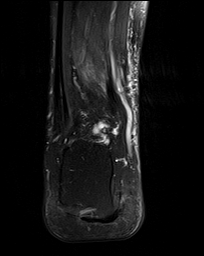
[im 45/55]
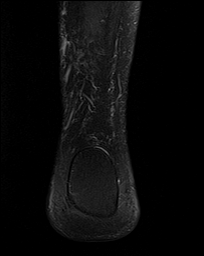
[im 50/55]
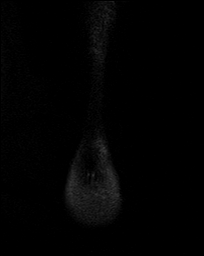
[im 55/55]
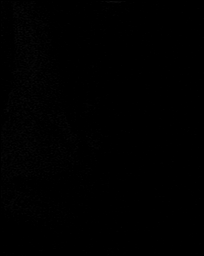

[Series 6: T1 · sagittal · 4.0mm · 0.59mm/px · 5 of 21 slices shown]
[im 1/21]
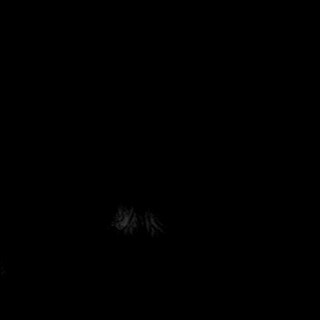
[im 6/21]
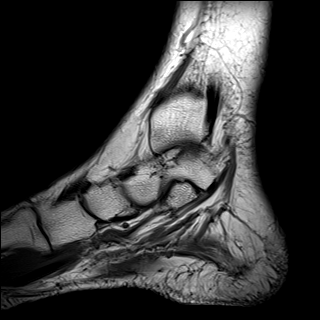
[im 11/21]
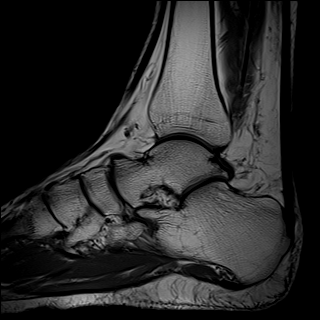
[im 16/21]
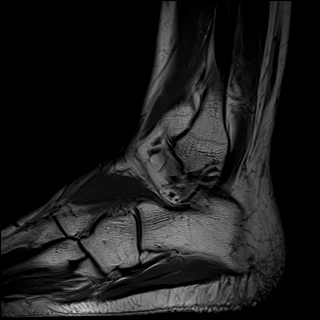
[im 21/21]
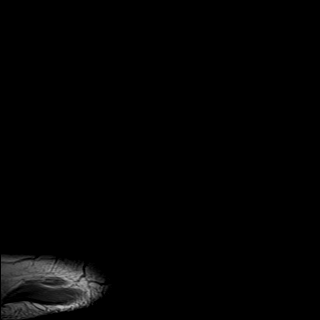

[Series 7: STIR · sagittal · 4.0mm · 0.59mm/px · 2 of 11 slices shown]
[im 1/11]
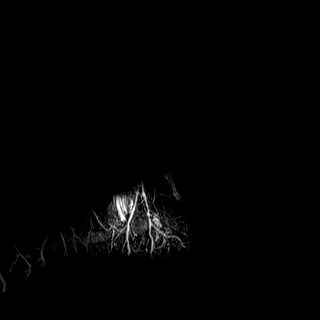
[im 11/11]
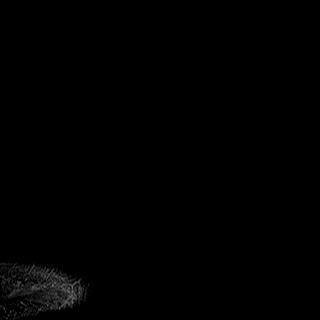

[40 of 40 positions shown; findings below may reference images not displayed]

FINDINGS: TENDONS

Peroneal: Peroneal longus tendon intact. Peroneal brevis intact.

Posteromedial: Posterior tibial tendon intact. Flexor digitorum
longus tendon intact. Flexor hallucis longus tendon intact.

Anterior: Tibialis anterior tendon intact. Extensor hallucis longus
tendon intact Extensor digitorum longus tendon intact.

Achilles:  Intact.

Plantar Fascia: Intact. Thickening of the proximal central and
lateral bands. Focal marrow edema at the calcaneal origin with small
enthesophyte.

LIGAMENTS

Lateral: Anterior talofibular ligament intact. Calcaneofibular
ligament intact. Posterior talofibular ligament intact. Anterior and
posterior tibiofibular ligaments intact.

Medial: Deltoid ligament intact. Spring ligament intact.

CARTILAGE

Ankle Joint: Trace joint effusion. Normal ankle mortise. No chondral
defect.

Subtalar Joints/Sinus Tarsi: Normal subtalar joints. No subtalar
joint effusion. Normal sinus tarsi.

Bones: No marrow signal abnormality.  No fracture or dislocation.

Soft Tissue: Medial hindfoot soft tissue swelling. No soft tissue
mass or fluid collection. Mild edema in the distal flexor hallucis
longus muscle.
IMPRESSION: 1. Mild plantar fasciitis. No tear.
2. Mild strain of the distal flexor hallucis longus muscle.

## 2022-05-29 ENCOUNTER — Ambulatory Visit: Payer: BC Managed Care – PPO | Admitting: Family Medicine

## 2022-06-07 ENCOUNTER — Ambulatory Visit: Payer: BC Managed Care – PPO | Admitting: Family Medicine

## 2022-06-07 ENCOUNTER — Encounter: Payer: Self-pay | Admitting: Family Medicine

## 2022-06-07 VITALS — BP 138/88 | HR 90 | Resp 16 | Ht 77.0 in | Wt 301.0 lb

## 2022-06-07 DIAGNOSIS — F419 Anxiety disorder, unspecified: Secondary | ICD-10-CM | POA: Diagnosis not present

## 2022-06-07 DIAGNOSIS — R634 Abnormal weight loss: Secondary | ICD-10-CM

## 2022-06-07 DIAGNOSIS — R5382 Chronic fatigue, unspecified: Secondary | ICD-10-CM

## 2022-06-07 DIAGNOSIS — I10 Essential (primary) hypertension: Secondary | ICD-10-CM | POA: Diagnosis not present

## 2022-06-07 MED ORDER — LOSARTAN POTASSIUM-HCTZ 100-25 MG PO TABS: 1.0000 | ORAL_TABLET | Freq: Every day | ORAL | 1 refills | Status: AC

## 2022-06-07 MED ORDER — PHENTERMINE HCL 37.5 MG PO CAPS: 37.5000 mg | ORAL_CAPSULE | ORAL | 2 refills | Status: AC

## 2022-06-07 MED ORDER — BUSPIRONE HCL 15 MG PO TABS: ORAL_TABLET | ORAL | 1 refills | Status: AC

## 2022-06-07 NOTE — Progress Notes (Signed)
I,Joseline E Rosas,acting as a scribe for [employer], MD.,have documented all relevant documentation on the behalf of Edgar Foster, MD,as directed by  Edgar Foster, MD while in the presence of Edgar Foster, MD.   Established patient visit   Patient: Edgar Martinez   DOB: [birth year]   36 y.o. Male  MRN: [id number] Visit Date: 06/07/2022  Today's healthcare provider: [name], MD   Chief Complaint  Patient presents with   HTN follow-up   Subjective    HPI  Hypertension, follow-up  BP Readings from Last 3 Encounters:  06/07/22 138/88  01/24/22 (!) 137/92  07/26/21 135/85   Wt Readings from Last 3 Encounters:  06/07/22 (!) 301 lb (136.5 kg)  01/24/22 (!) 320 lb (145.2 kg)  07/26/21 (!) 318 lb (144.2 kg)     He was last seen for hypertension 4 months ago.  BP at that visit was 137/92. Management since that visit included stopping amlodipine & continue losartan-HCTZ 100-32m daily.  He reports excellent compliance with treatment. He is not having side effects.  He is following a Regular diet.Reports restricting a lot of "things" like sodas. He is exercising.  Outside blood pressures are not being checked. Had a health screening at work ad reports BP reading was within normal limits can't remember what it was. Symptoms: No chest pain No chest pressure  No palpitations No syncope  No dyspnea No orthopnea  No paroxysmal nocturnal dyspnea Yes- lower extremity edema-a little-much better than before.   Pertinent labs Lab Results  Component Value Date   CHOL 180 07/26/2021   HDL 38 (L) 07/26/2021   LDLCALC 116 (H) 07/26/2021   TRIG 143 07/26/2021   CHOLHDL 4.7 07/26/2021   Lab Results  Component Value Date   NA 140 07/26/2021   K 3.8 07/26/2021   CREATININE 1.27 07/26/2021   EGFR 75 07/26/2021   GLUCOSE 100 (H) 07/26/2021   TSH 1.010 01/24/2022     The ASCVD Risk score (Arnett DK, et al., 2019)  failed to calculate for the following reasons:   The 2019 ASCVD risk score is only valid for ages 459to 761 ---------------------------------------------------------------------------------------------------   Weight Loss Medication  Patient states that he would like to continue phentermine for an additional three months prior to discontinuing to use it to help with weight loss  We discussed that this class of medications interferes with goal for BP control, however, patient wishes to continue for 3 more months  Patient has lost 19 pounds since last visit in June  He would like to continue with efforts for weight loss in order to decrease risks for cardiac events given his family hx of heart disease and elevated cholesterol in his sibling  He also adds that his wife has been dealing with health issues and son needs surgery so stress has been increased and affects his health as well   Fatigue  Patient adds near end of visit that he and Dr.Gilbert discussed collecting an iron panel for his hx of chronic fatigue  Patient would like to have that blood work drawn today    Anxiety  Patient requests refills for buspirone 149mtwice daily  States that anxiety is stable given stressors at home      Medications: Outpatient Medications Prior to Visit  Medication Sig   lansoprazole (PREVACID) 30 MG capsule Take 1 capsule (30 mg total) by mouth 2 (two) times daily before a meal.   traZODone (DESYREL) 100 MG tablet TAKE  ONE TABLET BY MOUTH EVERY NIGHT AT BEDTIME   [DISCONTINUED] busPIRone (BUSPAR) 15 MG tablet TAKE ONE TABLET BY MOUTH TWICE A DAY   [DISCONTINUED] losartan-hydrochlorothiazide (HYZAAR) 100-25 MG tablet Take 1 tablet by mouth daily.   [DISCONTINUED] phentermine 37.5 MG capsule Take 1 capsule (37.5 mg total) by mouth every morning.   [DISCONTINUED] amLODipine (NORVASC) 2.5 MG tablet TAKE ONE TABLET BY MOUTH DAILY   [DISCONTINUED] Dexlansoprazole (DEXILANT) 30 MG capsule DR TAKE ONE  CAPSULE BY MOUTH DAILY   [DISCONTINUED] sildenafil (REVATIO) 20 MG tablet TAKE 1-5 TABLETS BY MOUTH 1 HOUR PRIOR TO INTERCOURSE (Patient not taking: Reported on 07/21/2020)   No facility-administered medications prior to visit.    Review of Systems     Objective    BP 138/88 (BP Location: Left Arm, Patient Position: Sitting, Cuff Size: Large)   Pulse 90   Resp 16   Ht 6' 5"  (1.956 m)   Wt (!) 301 lb (136.5 kg)   BMI 35.69 kg/m    Physical Exam Vitals reviewed.  Constitutional:      General: He is not in acute distress.    Appearance: Normal appearance. He is not ill-appearing, toxic-appearing or diaphoretic.  Eyes:     Conjunctiva/sclera: Conjunctivae normal.  Cardiovascular:     Rate and Rhythm: Normal rate and regular rhythm.     Pulses: Normal pulses.     Heart sounds: Normal heart sounds. No murmur heard.    No friction rub. No gallop.  Pulmonary:     Effort: Pulmonary effort is normal. No respiratory distress.     Breath sounds: Normal breath sounds. No stridor. No wheezing, rhonchi or rales.  Abdominal:     General: Bowel sounds are normal. There is no distension.     Palpations: Abdomen is soft.     Tenderness: There is no abdominal tenderness.  Musculoskeletal:     Right lower leg: No edema.     Left lower leg: No edema.  Skin:    Findings: No erythema or rash.  Neurological:     Mental Status: He is alert and oriented to person, place, and time.  Psychiatric:        Mood and Affect: Mood normal.        Speech: Speech normal.        Behavior: Behavior normal. Behavior is cooperative.        Thought Content: Thought content does not include suicidal ideation. Thought content does not include suicidal plan.       No results found for any visits on 06/07/22.  Assessment & Plan     Problem List Items Addressed This Visit       Cardiovascular and Mediastinum   Essential hypertension - Primary   Relevant Medications   losartan-hydrochlorothiazide  (HYZAAR) 100-25 MG tablet     Other   Anxiety    Chronic, stable  Refills for buspirone 31m provided today  Will continue trazodone 1057mqHS for sleep       Relevant Medications   busPIRone (BUSPAR) 15 MG tablet   Chronic fatigue    Order placed for iron panel as discussed with previous provider        Relevant Orders   Iron, TIBC and Ferritin Panel   Weight loss due to medication    Patient would like to continue phentermine 37.26m38maily for weight loss  Has lost 19 pounds since June  We discussed that this counter acts efforts to lower BP, patient voices understanding and  would like to continue for three months before considering discontinuation of medication  Refills provided         Return in about 2 months (around 08/07/2022) for BP, .      I, Edgar Foster, MD, have reviewed all documentation for this visit. The documentation on 06/09/22 for the exam, diagnosis, procedures, and orders are all accurate and complete.  Portions of this information were initially documented by the CMA and reviewed by me for thoroughness and accuracy.      Edgar Foster, MD  [facility] [phone] (phone) [phone] (fax)  [place]

## 2022-06-09 DIAGNOSIS — R634 Abnormal weight loss: Secondary | ICD-10-CM | POA: Insufficient documentation

## 2022-06-09 DIAGNOSIS — R5382 Chronic fatigue, unspecified: Secondary | ICD-10-CM | POA: Insufficient documentation

## 2022-06-09 NOTE — Assessment & Plan Note (Signed)
Chronic, stable  Refills for buspirone '15mg'$  provided today  Will continue trazodone '100mg'$  qHS for sleep

## 2022-06-09 NOTE — Assessment & Plan Note (Signed)
Order placed for iron panel as discussed with previous provider

## 2022-06-09 NOTE — Assessment & Plan Note (Signed)
Patient would like to continue phentermine 37.'5mg'$  daily for weight loss  Has lost 19 pounds since June  We discussed that this counter acts efforts to lower BP, patient voices understanding and would like to continue for three months before considering discontinuation of medication  Refills provided

## 2022-07-03 DIAGNOSIS — J0100 Acute maxillary sinusitis, unspecified: Secondary | ICD-10-CM | POA: Diagnosis not present

## 2022-07-26 NOTE — Progress Notes (Signed)
I,Joseline E Rosas,acting as a scribe for [employer], MD.,have documented all relevant documentation on the behalf of [name], MD,as directed by  [name], MD while in the presence of [name], MD.   Complete physical exam   Patient: Edgar Martinez   DOB: 08/27/1985   36 y.o. Male  MRN: [id number] Visit Date: 07/27/2022  Today's healthcare provider: [name], MD   Chief Complaint  Patient presents with   Annual Exam   Subjective    HAIM HANSSON is a 36 y.o. male who presents today for a complete physical exam.   He reports consuming a general diet. The patient has a physically strenuous job, but has no regular exercise apart from work.  He generally feels well. He does not have additional problems to discuss today.   HPI  HTN: BP recordings at home ranging in the 130s/80s  Denies HA, vision changes    Past Medical History:  Diagnosis Date   Chicken pox    GERD (gastroesophageal reflux disease)    Hypertension    Past Surgical History:  Procedure Laterality Date   HERNIA REPAIR  2012   Bilateral inguinal hernia with mesh.   VASECTOMY     2015   WRIST SURGERY  2015   Social History   Socioeconomic History   Marital status: Married    Spouse name: Not on file   Number of children: Not on file   Years of education: Not on file   Highest education level: Not on file  Occupational History   Not on file  Tobacco Use   Smoking status: Former    Packs/day: 0.50    Years: 3.00    Total pack years: 1.50    Types: Cigarettes    Quit date: 2010    Years since quitting: 13.9   Smokeless tobacco: Never  Vaping Use   Vaping Use: Never used  Substance and Sexual Activity   Alcohol use: Yes   Drug use: No   Sexual activity: Yes    Partners: Female  Other Topics Concern   Not on file  Social History Narrative   Not on file   Social Determinants of Health   Financial Resource  Strain: Not on file  Food Insecurity: Not on file  Transportation Needs: Not on file  Physical Activity: Not on file  Stress: Not on file  Social Connections: Not on file  Intimate Partner Violence: Not on file   Family Status  Relation Name Status   Mother  (Not Specified)   Father  (Not Specified)   MGF  Deceased   PGF  Deceased   MGM  Deceased   PGM  Deceased   Family History  Problem Relation Age of Onset   Hyperlipidemia Mother    Hypertension Mother    Hypothyroidism Mother    Hyperlipidemia Father    Hypertension Father    Heart disease Father    Diabetes Maternal Grandfather    Heart attack Maternal Grandfather    Lung cancer Paternal Grandfather    CVA Maternal Grandmother    Heart attack Maternal Grandmother    Stomach cancer Paternal Grandmother    No Known Allergies  Patient Care Team: Simmons-Robinson, Riki Sheer, MD as PCP - General (Family Medicine)   Medications: Outpatient Medications Prior to Visit  Medication Sig   busPIRone (BUSPAR) 15 MG tablet TAKE ONE TABLET BY MOUTH TWICE A DAY   lansoprazole (PREVACID) 30 MG capsule Take 1 capsule (30  mg total) by mouth 2 (two) times daily before a meal.   losartan-hydrochlorothiazide (HYZAAR) 100-25 MG tablet Take 1 tablet by mouth daily.   phentermine 37.5 MG capsule Take 1 capsule (37.5 mg total) by mouth every morning.   traZODone (DESYREL) 100 MG tablet TAKE ONE TABLET BY MOUTH EVERY NIGHT AT BEDTIME   No facility-administered medications prior to visit.    Review of Systems  All other systems reviewed and are negative.     Objective    BP (!) 145/98 (BP Location: Left Arm, Patient Position: Sitting, Cuff Size: Large)   Pulse 72   Temp 98.4 F (36.9 C) (Oral)   Resp 16   Ht '6\' 5"'$  (1.956 m)   Wt (!) 301 lb (136.5 kg)   BMI 35.69 kg/m     Physical Exam Vitals reviewed.  Constitutional:      General: He is not in acute distress.    Appearance: Normal appearance. He is not ill-appearing,  toxic-appearing or diaphoretic.  HENT:     Head: Normocephalic and atraumatic.     Right Ear: Tympanic membrane and external ear normal.     Left Ear: Tympanic membrane and external ear normal.     Nose: Nose normal.     Mouth/Throat:     Mouth: Mucous membranes are moist.     Pharynx: No oropharyngeal exudate or posterior oropharyngeal erythema.  Eyes:     General: No scleral icterus.    Extraocular Movements: Extraocular movements intact.     Conjunctiva/sclera: Conjunctivae normal.     Pupils: Pupils are equal, round, and reactive to light.  Neck:     Vascular: No carotid bruit.  Cardiovascular:     Rate and Rhythm: Normal rate and regular rhythm.     Pulses: Normal pulses.     Heart sounds: Normal heart sounds. No murmur heard.    No friction rub. No gallop.  Pulmonary:     Effort: Pulmonary effort is normal. No respiratory distress.     Breath sounds: Normal breath sounds. No stridor. No wheezing, rhonchi or rales.  Abdominal:     General: Bowel sounds are normal. There is no distension.     Palpations: Abdomen is soft.     Tenderness: There is no abdominal tenderness.  Musculoskeletal:        General: No swelling, tenderness or signs of injury. Normal range of motion.     Cervical back: Normal range of motion and neck supple. No rigidity or tenderness.     Right lower leg: No edema.     Left lower leg: Edema present.     Comments: No overlying erythema (chronic)  Lymphadenopathy:     Cervical: No cervical adenopathy.  Skin:    General: Skin is warm and dry.     Capillary Refill: Capillary refill takes less than 2 seconds.     Findings: No erythema or rash.  Neurological:     Mental Status: He is alert and oriented to person, place, and time.     Motor: No weakness.     Gait: Gait normal.  Psychiatric:        Attention and Perception: Attention normal.        Mood and Affect: Mood normal.        Speech: Speech normal.        Behavior: Behavior normal. Behavior is  cooperative.        Thought Content: Thought content normal.       Last depression  screening scores    07/27/2022    1:31 PM 06/07/2022    4:17 PM 01/24/2022    8:08 AM  PHQ 2/9 Scores  PHQ - 2 Score '2 2 1  '$ PHQ- 9 Score '5 7 6   '$ Last fall risk screening    07/27/2022    1:30 PM  [place] in the past year? 0  Number falls in past yr: 0  Injury with Fall? 0  Risk for fall due to : No Fall Risks   Last Audit-C alcohol use screening    07/27/2022    1:31 PM  Alcohol Use Disorder Test (AUDIT)  1. How often do you have a drink containing alcohol? 1  2. How many drinks containing alcohol do you have on a typical day when you are drinking? 0  3. How often do you have six or more drinks on one occasion? 0  AUDIT-C Score 1   A score of 3 or more in women, and 4 or more in men indicates increased risk for alcohol abuse, EXCEPT if all of the points are from question 1   No results found for any visits on 07/27/22.  Assessment & Plan    Routine Health Maintenance and Physical Exam  Exercise Activities and Dietary recommendations  Goals   None     Immunization History  Administered Date(s) Administered   PFIZER(Purple Top)SARS-COV-2 Vaccination 11/13/2019, 12/04/2019   Tdap 03/31/2013, 03/31/2016    Health Maintenance  Topic Date Due   COVID-19 Vaccine (3 - 2023-24 season) 04/14/2022   INFLUENZA VACCINE  11/12/2022 (Originally 03/14/2022)   DTaP/Tdap/Td (3 - Td or Tdap) 03/31/2026   Hepatitis C Screening  Completed   HIV Screening  Completed   HPV VACCINES  Aged Out    Discussed health benefits of physical activity, and encouraged him to engage in regular exercise appropriate for his age and condition.  Problem List Items Addressed This Visit       Cardiovascular and Mediastinum   Essential hypertension - Primary    BP elevated on two measurments  Patient agreeable to low dose BB  Will start 12.'5mg'$  daily metoprolol  He will continue losartan 100-hctz  '25mg'$  daily and will check home BP measuements  Virtual visit for BP chck in 2 weeks       Relevant Medications   metoprolol succinate (TOPROL-XL) 25 MG 24 hr tablet   Other Relevant Orders   Comprehensive metabolic panel   Aldosterone + renin activity w/ ratio     Other   Class 2 severe obesity with serious comorbidity and body mass index (BMI) of 36.0 to 36.9 in adult [facility])   Relevant Orders   Hemoglobin A1c   Comprehensive metabolic panel   Lipid panel   TSH + free T4   Weight loss due to medication   Relevant Orders   TSH + free T4   Other Visit Diagnoses     Low mean corpuscular volume (MCV)       Relevant Orders   CBC   Iron, TIBC and Ferritin Panel       Return in about 2 weeks (around 08/10/2022) for HTN med change .     I, [name], MD, have reviewed all documentation for this visit.  Portions of this information were initially documented by the CMA and reviewed by me for thoroughness and accuracy.      [name], MD  [facility] [phone] (phone) [phone] (fax)  [facility]

## 2022-07-27 ENCOUNTER — Encounter: Payer: Self-pay | Admitting: Family Medicine

## 2022-07-27 ENCOUNTER — Ambulatory Visit (INDEPENDENT_AMBULATORY_CARE_PROVIDER_SITE_OTHER): Payer: BC Managed Care – PPO | Admitting: Family Medicine

## 2022-07-27 ENCOUNTER — Other Ambulatory Visit: Payer: Self-pay | Admitting: Family Medicine

## 2022-07-27 ENCOUNTER — Encounter: Payer: BC Managed Care – PPO | Admitting: Family Medicine

## 2022-07-27 VITALS — BP 145/98 | HR 72 | Temp 98.4°F | Resp 16 | Ht 77.0 in | Wt 301.0 lb

## 2022-07-27 DIAGNOSIS — Z6836 Body mass index (BMI) 36.0-36.9, adult: Secondary | ICD-10-CM

## 2022-07-27 DIAGNOSIS — R634 Abnormal weight loss: Secondary | ICD-10-CM | POA: Diagnosis not present

## 2022-07-27 DIAGNOSIS — Z0000 Encounter for general adult medical examination without abnormal findings: Secondary | ICD-10-CM | POA: Diagnosis not present

## 2022-07-27 DIAGNOSIS — R718 Other abnormality of red blood cells: Secondary | ICD-10-CM | POA: Diagnosis not present

## 2022-07-27 DIAGNOSIS — I10 Essential (primary) hypertension: Secondary | ICD-10-CM

## 2022-07-27 MED ORDER — METOPROLOL SUCCINATE ER 25 MG PO TB24: 12.5000 mg | ORAL_TABLET | Freq: Every day | ORAL | 0 refills | Status: AC

## 2022-07-27 NOTE — Assessment & Plan Note (Signed)
BP elevated on two measurments  Patient agreeable to low dose BB  Will start 12.'5mg'$  daily metoprolol  He will continue losartan 100-hctz '25mg'$  daily and will check home BP measuements  Virtual visit for BP chck in 2 weeks

## 2022-07-27 NOTE — Patient Instructions (Signed)
Health Maintenance, Male Adopting a healthy lifestyle and getting preventive care are important in promoting health and wellness. Ask your health care provider about: The right schedule for you to have regular tests and exams. Things you can do on your own to prevent diseases and keep yourself healthy. What should I know about diet, weight, and exercise? Eat a healthy diet  Eat a diet that includes plenty of vegetables, fruits, low-fat dairy products, and lean protein. Do not eat a lot of foods that are high in solid fats, added sugars, or sodium. Maintain a healthy weight Body mass index (BMI) is a measurement that can be used to identify possible weight problems. It estimates body fat based on height and weight. Your health care provider can help determine your BMI and help you achieve or maintain a healthy weight. Get regular exercise Get regular exercise. This is one of the most important things you can do for your health. Most adults should: Exercise for at least 150 minutes each week. The exercise should increase your heart rate and make you sweat (moderate-intensity exercise). Do strengthening exercises at least twice a week. This is in addition to the moderate-intensity exercise. Spend less time sitting. Even light physical activity can be beneficial. Watch cholesterol and blood lipids Have your blood tested for lipids and cholesterol at 37 years of age, then have this test every 5 years. You may need to have your cholesterol levels checked more often if: Your lipid or cholesterol levels are high. You are older than 37 years of age. You are at high risk for heart disease. What should I know about cancer screening? Many types of cancers can be detected early and may often be prevented. Depending on your health history and family history, you may need to have cancer screening at various ages. This may include screening for: Colorectal cancer. Prostate cancer. Skin cancer. Lung  cancer. What should I know about heart disease, diabetes, and high blood pressure? Blood pressure and heart disease High blood pressure causes heart disease and increases the risk of stroke. This is more likely to develop in people who have high blood pressure readings or are overweight. Talk with your health care provider about your target blood pressure readings. Have your blood pressure checked: Every 3-5 years if you are 18-39 years of age. Every year if you are 40 years old or older. If you are between the ages of 65 and 75 and are a current or former smoker, ask your health care provider if you should have a one-time screening for abdominal aortic aneurysm (AAA). Diabetes Have regular diabetes screenings. This checks your fasting blood sugar level. Have the screening done: Once every three years after age 45 if you are at a normal weight and have a low risk for diabetes. More often and at a younger age if you are overweight or have a high risk for diabetes. What should I know about preventing infection? Hepatitis B If you have a higher risk for hepatitis B, you should be screened for this virus. Talk with your health care provider to find out if you are at risk for hepatitis B infection. Hepatitis C Blood testing is recommended for: Everyone born from 1945 through 1965. Anyone with known risk factors for hepatitis C. Sexually transmitted infections (STIs) You should be screened each year for STIs, including gonorrhea and chlamydia, if: You are sexually active and are younger than 37 years of age. You are older than 37 years of age and your   health care provider tells you that you are at risk for this type of infection. Your sexual activity has changed since you were last screened, and you are at increased risk for chlamydia or gonorrhea. Ask your health care provider if you are at risk. Ask your health care provider about whether you are at high risk for HIV. Your health care provider  may recommend a prescription medicine to help prevent HIV infection. If you choose to take medicine to prevent HIV, you should first get tested for HIV. You should then be tested every 3 months for as long as you are taking the medicine. Follow these instructions at home: Alcohol use Do not drink alcohol if your health care provider tells you not to drink. If you drink alcohol: Limit how much you have to 0-2 drinks a day. Know how much alcohol is in your drink. In the [place] one drink equals one 12 oz bottle of beer (355 mL), one 5 oz glass of wine (148 mL), or one 1 oz glass of hard liquor (44 mL). Lifestyle Do not use any products that contain nicotine or tobacco. These products include cigarettes, chewing tobacco, and vaping devices, such as e-cigarettes. If you need help quitting, ask your health care provider. Do not use street drugs. Do not share needles. Ask your health care provider for help if you need support or information about quitting drugs. General instructions Schedule regular health, dental, and eye exams. Stay current with your vaccines. Tell your health care provider if: You often feel depressed. You have ever been abused or do not feel safe at home. Summary Adopting a healthy lifestyle and getting preventive care are important in promoting health and wellness. Follow your health care provider's instructions about healthy diet, exercising, and getting tested or screened for diseases. Follow your health care provider's instructions on monitoring your cholesterol and blood pressure. This information is not intended to replace advice given to you by your health care provider. Make sure you discuss any questions you have with your health care provider. Document Revised: 12/20/2020 Document Reviewed: 12/20/2020 Elsevier Patient Education  2023 [employer]  

## 2022-08-03 LAB — COMPREHENSIVE METABOLIC PANEL
ALT: 23 IU/L (ref 0–44)
AST: 24 IU/L (ref 0–40)
Albumin/Globulin Ratio: 1.9 (ref 1.2–2.2)
Albumin: 4.6 g/dL (ref 4.1–5.1)
Alkaline Phosphatase: 52 IU/L (ref 44–121)
BUN/Creatinine Ratio: 9 (ref 9–20)
BUN: 11 mg/dL (ref 6–20)
Bilirubin Total: 0.6 mg/dL (ref 0.0–1.2)
CO2: 25 mmol/L (ref 20–29)
Calcium: 9.7 mg/dL (ref 8.7–10.2)
Chloride: 100 mmol/L (ref 96–106)
Creatinine, Ser: 1.28 mg/dL — ABNORMAL HIGH (ref 0.76–1.27)
Globulin, Total: 2.4 g/dL (ref 1.5–4.5)
Glucose: 93 mg/dL (ref 70–99)
Potassium: 3.9 mmol/L (ref 3.5–5.2)
Sodium: 140 mmol/L (ref 134–144)
Total Protein: 7 g/dL (ref 6.0–8.5)
eGFR: 74 mL/min/{1.73_m2} (ref >59)

## 2022-08-03 LAB — ALDOSTERONE + RENIN ACTIVITY W/ RATIO
Aldos/Renin Ratio: 0.4 (ref 0.0–30.0)
Aldosterone: 3.7 ng/dL (ref 0.0–30.0)
Renin Activity, Plasma: 8.767 ng/mL/h — ABNORMAL HIGH (ref 0.167–5.380)

## 2022-08-03 LAB — CBC
Hematocrit: 39.7 % (ref 37.5–51.0)
Hemoglobin: 12.8 g/dL — ABNORMAL LOW (ref 13.0–17.7)
MCH: 23.4 pg — ABNORMAL LOW (ref 26.6–33.0)
MCHC: 32.2 g/dL (ref 31.5–35.7)
MCV: 72 fL — ABNORMAL LOW (ref 79–97)
Platelets: 242 10*3/uL (ref 150–450)
RBC: 5.48 x10E6/uL (ref 4.14–5.80)
RDW: 14.8 % (ref 11.6–15.4)
WBC: 6.1 10*3/uL (ref 3.4–10.8)

## 2022-08-03 LAB — IRON,TIBC AND FERRITIN PANEL
Ferritin: 19 ng/mL — ABNORMAL LOW (ref 30–400)
Iron Saturation: 23 % (ref 15–55)
Iron: 87 ug/dL (ref 38–169)
Total Iron Binding Capacity: 383 ug/dL (ref 250–450)
UIBC: 296 ug/dL (ref 111–343)

## 2022-08-03 LAB — TSH+FREE T4
Free T4: 1.5 ng/dL (ref 0.82–1.77)
TSH: 0.719 u[IU]/mL (ref 0.450–4.500)

## 2022-08-03 LAB — LIPID PANEL
Chol/HDL Ratio: 5.1 ratio — ABNORMAL HIGH (ref 0.0–5.0)
Cholesterol, Total: 193 mg/dL (ref 100–199)
HDL: 38 mg/dL — ABNORMAL LOW
LDL Chol Calc (NIH): 137 mg/dL — ABNORMAL HIGH (ref 0–99)
Triglycerides: 99 mg/dL (ref 0–149)
VLDL Cholesterol Cal: 18 mg/dL (ref 5–40)

## 2022-08-03 LAB — HEMOGLOBIN A1C
Est. average glucose Bld gHb Est-mCnc: 120 mg/dL
Hgb A1c MFr Bld: 5.8 % — ABNORMAL HIGH (ref 4.8–5.6)

## 2022-08-09 NOTE — Progress Notes (Unsigned)
MyChart Video Visit    Virtual Visit via Video Note   This format is felt to be most appropriate for this patient at this time. Physical exam was limited by quality of the video and audio technology used for the visit.   Patient location: home address  Provider location:  [facility] [address] [place]  [place], [place] [address]   I discussed the limitations of evaluation and management by telemedicine and the availability of in person appointments. The patient expressed understanding and agreed to proceed.  Patient: Edgar Martinez   DOB: 1986/02/08   36 y.o. Male  MRN: [id number] Visit Date: 08/10/2022  Today's healthcare provider: [name], MD   No chief complaint on file.  Subjective    HPI  Hypertension: Patient here for 2 weeks follow-up of elevated blood pressure. He was advised to 12.'5mg'$  daily metoprolol which he has been adherent to since last visit.  He will continue losartan 100-hctz '25mg'$  daily and will check home BP measurements. Bp's at home are between 140-150/90s. Cardiac symptoms none. Patient denies none.     Weight Loss 2/2 to Medication  Reports pants are fitting more  loosely  He had to order smaller size  Did not report weight today  Reports he has been decreasing soft drink consumption, is back in the gym regularly and is practicing more portion control    BP Readings from Last 3 Encounters:  07/27/22 (!) 145/98  06/07/22 138/88  01/24/22 (!) 137/92     Medications: Outpatient Medications Prior to Visit  Medication Sig   busPIRone (BUSPAR) 15 MG tablet TAKE ONE TABLET BY MOUTH TWICE A DAY   lansoprazole (PREVACID) 30 MG capsule Take 1 capsule (30 mg total) by mouth 2 (two) times daily before a meal.   losartan-hydrochlorothiazide (HYZAAR) 100-25 MG tablet Take 1 tablet by mouth daily.   metoprolol succinate (TOPROL-XL) 25 MG 24 hr tablet Take 0.5 tablets (12.5 mg total) by mouth daily.   phentermine 37.5  MG capsule Take 1 capsule (37.5 mg total) by mouth every morning.   traZODone (DESYREL) 100 MG tablet TAKE ONE TABLET BY MOUTH EVERY NIGHT AT BEDTIME   No facility-administered medications prior to visit.    Review of Systems     Objective    There were no vitals taken for this visit.     Physical Exam Pulmonary:     Effort: Pulmonary effort is normal.     Breath sounds: Normal breath sounds.     Comments: Speaking in complete sentences, does not sound as if he is in respiratory distress       Assessment & Plan     Problem List Items Addressed This Visit       Cardiovascular and Mediastinum   Essential hypertension - Primary    BP remains elevated above goal range  Recommended increasing to '25mg'$  metoprolol from 1/2 tablet  Discussed eventually discontinuing phentermine and continuing healthy lifestyle choices for weight management and likely improved BP  He will continue hyzaar 100-'25mg'$  daily          Other   Weight loss due to medication    Reports loosely fitting clothing  No weight reported for today's visit  Patient would like to continue for 1.28month as part of 3 month plan  Will plan for follow up in mid to late Jan 2024  Continue phentermine 37.'5mg'$         Return in about 6 weeks (around 09/21/2022) for  weight loss med and HTN .     I discussed the assessment and treatment plan with the patient. The patient was provided an opportunity to ask questions and all were answered. The patient agreed with the plan and demonstrated an understanding of the instructions.   The patient was advised to call back or seek an in-person evaluation if the symptoms worsen or if the condition fails to improve as anticipated.  I provided 7 minutes of non-face-to-face time during this encounter.  I, [name], MD, have reviewed all documentation for this visit.  Portions of this information were initially documented by the CMA and reviewed by me for  thoroughness and accuracy.     [name], MD [facility] [phone] (phone) [phone] (fax)  [place]

## 2022-08-10 ENCOUNTER — Telehealth (INDEPENDENT_AMBULATORY_CARE_PROVIDER_SITE_OTHER): Payer: BC Managed Care – PPO | Admitting: Family Medicine

## 2022-08-10 ENCOUNTER — Encounter: Payer: Self-pay | Admitting: Family Medicine

## 2022-08-10 DIAGNOSIS — T50905A Adverse effect of unspecified drugs, medicaments and biological substances, initial encounter: Secondary | ICD-10-CM | POA: Diagnosis not present

## 2022-08-10 DIAGNOSIS — I10 Essential (primary) hypertension: Secondary | ICD-10-CM

## 2022-08-10 DIAGNOSIS — R634 Abnormal weight loss: Secondary | ICD-10-CM | POA: Diagnosis not present

## 2022-08-10 NOTE — Assessment & Plan Note (Signed)
BP remains elevated above goal range  Recommended increasing to '25mg'$  metoprolol from 1/2 tablet  Discussed eventually discontinuing phentermine and continuing healthy lifestyle choices for weight management and likely improved BP  He will continue hyzaar 100-'25mg'$  daily

## 2022-08-10 NOTE — Patient Instructions (Signed)
Please increase to the whole '25mg'$  tablet of metoprolol in addition to your hyzaar   Please monitor BP daily over the next few weeks in preparation for our next visit   Please call to schedule follow up in 6 weeks for weight management and blood pressure

## 2022-08-10 NOTE — Assessment & Plan Note (Signed)
Reports loosely fitting clothing  No weight reported for today's visit  Patient would like to continue for 1.43month as part of 3 month plan  Will plan for follow up in mid to late Jan 2024  Continue phentermine 37.'5mg'$ 

## 2022-11-15 DIAGNOSIS — I10 Essential (primary) hypertension: Secondary | ICD-10-CM | POA: Diagnosis not present

## 2022-11-15 DIAGNOSIS — K219 Gastro-esophageal reflux disease without esophagitis: Secondary | ICD-10-CM | POA: Diagnosis not present

## 2022-11-15 DIAGNOSIS — R739 Hyperglycemia, unspecified: Secondary | ICD-10-CM | POA: Diagnosis not present

## 2022-11-23 ENCOUNTER — Other Ambulatory Visit: Payer: Self-pay | Admitting: Family Medicine

## 2023-03-20 DIAGNOSIS — Z0000 Encounter for general adult medical examination without abnormal findings: Secondary | ICD-10-CM | POA: Diagnosis not present

## 2023-03-20 DIAGNOSIS — I10 Essential (primary) hypertension: Secondary | ICD-10-CM | POA: Diagnosis not present

## 2023-03-20 DIAGNOSIS — R739 Hyperglycemia, unspecified: Secondary | ICD-10-CM | POA: Diagnosis not present

## 2023-05-23 DIAGNOSIS — R7303 Prediabetes: Secondary | ICD-10-CM | POA: Diagnosis not present

## 2023-05-23 DIAGNOSIS — I10 Essential (primary) hypertension: Secondary | ICD-10-CM | POA: Diagnosis not present

## 2023-05-23 DIAGNOSIS — E66812 Obesity, class 2: Secondary | ICD-10-CM | POA: Diagnosis not present
